# Patient Record
Sex: Female | Born: 1935 | Race: White | Hispanic: No | Marital: Single | Smoking: Never smoker
Health system: Southern US, Community
[De-identification: ages and names within clinical notes are randomized; demographics above are authoritative.]

## PROBLEM LIST (undated history)

## (undated) DIAGNOSIS — I10 Essential (primary) hypertension: Secondary | ICD-10-CM

## (undated) DIAGNOSIS — E079 Disorder of thyroid, unspecified: Secondary | ICD-10-CM

## (undated) DIAGNOSIS — I2510 Atherosclerotic heart disease of native coronary artery without angina pectoris: Secondary | ICD-10-CM

## (undated) DIAGNOSIS — E119 Type 2 diabetes mellitus without complications: Secondary | ICD-10-CM

## (undated) DIAGNOSIS — H3530 Unspecified macular degeneration: Secondary | ICD-10-CM

## (undated) HISTORY — PX: ABDOMINAL HYSTERECTOMY: SHX81

## (undated) HISTORY — PX: FRACTURE SURGERY: SHX138

---

## 2005-09-07 ENCOUNTER — Ambulatory Visit: Payer: Self-pay | Admitting: Internal Medicine

## 2005-09-19 ENCOUNTER — Ambulatory Visit: Payer: Self-pay | Admitting: Internal Medicine

## 2005-10-12 ENCOUNTER — Ambulatory Visit: Payer: Self-pay | Admitting: Surgery

## 2005-10-25 ENCOUNTER — Ambulatory Visit: Payer: Self-pay | Admitting: Surgery

## 2005-10-25 ENCOUNTER — Other Ambulatory Visit: Payer: Self-pay

## 2005-11-01 ENCOUNTER — Ambulatory Visit: Payer: Self-pay | Admitting: Surgery

## 2006-09-25 ENCOUNTER — Ambulatory Visit: Payer: Self-pay | Admitting: Internal Medicine

## 2007-01-31 ENCOUNTER — Ambulatory Visit: Payer: Self-pay | Admitting: Internal Medicine

## 2007-02-26 ENCOUNTER — Ambulatory Visit: Payer: Self-pay | Admitting: Internal Medicine

## 2007-03-29 ENCOUNTER — Ambulatory Visit: Payer: Self-pay | Admitting: Internal Medicine

## 2007-09-27 ENCOUNTER — Ambulatory Visit: Payer: Self-pay | Admitting: Internal Medicine

## 2008-11-24 ENCOUNTER — Ambulatory Visit: Payer: Self-pay | Admitting: Internal Medicine

## 2009-11-25 ENCOUNTER — Ambulatory Visit: Payer: Self-pay | Admitting: Internal Medicine

## 2010-11-30 ENCOUNTER — Ambulatory Visit: Payer: Self-pay | Admitting: Internal Medicine

## 2012-01-11 ENCOUNTER — Ambulatory Visit: Payer: Self-pay | Admitting: Internal Medicine

## 2012-01-16 ENCOUNTER — Inpatient Hospital Stay: Payer: Self-pay | Admitting: Internal Medicine

## 2012-01-16 LAB — CBC
HCT: 39.5 % (ref 35.0–47.0)
HGB: 13 g/dL (ref 12.0–16.0)
MCH: 32.2 pg (ref 26.0–34.0)
MCHC: 33 g/dL (ref 32.0–36.0)
MCV: 98 fL (ref 80–100)
Platelet: 161 10*3/uL (ref 150–440)
RBC: 4.04 10*6/uL (ref 3.80–5.20)

## 2012-01-16 LAB — BASIC METABOLIC PANEL
BUN: 17 mg/dL (ref 7–18)
Co2: 24 mmol/L (ref 21–32)
EGFR (African American): 53 — ABNORMAL LOW
Glucose: 162 mg/dL — ABNORMAL HIGH (ref 65–99)
Osmolality: 277 (ref 275–301)
Potassium: 3.9 mmol/L (ref 3.5–5.1)
Sodium: 136 mmol/L (ref 136–145)

## 2012-01-16 LAB — TROPONIN I: Troponin-I: 0.04 ng/mL

## 2012-01-16 LAB — PROTIME-INR: Prothrombin Time: 26.8 secs — ABNORMAL HIGH (ref 11.5–14.7)

## 2012-01-16 LAB — TSH: Thyroid Stimulating Horm: 3.18 u[IU]/mL

## 2012-01-16 LAB — CK TOTAL AND CKMB (NOT AT ARMC)
CK, Total: 112 U/L (ref 21–215)
CK-MB: 0.8 ng/mL (ref 0.5–3.6)

## 2012-01-17 LAB — TROPONIN I
Troponin-I: 0.06 ng/mL — ABNORMAL HIGH
Troponin-I: 0.07 ng/mL — ABNORMAL HIGH

## 2012-01-18 LAB — PROTIME-INR: INR: 2.3

## 2012-01-19 LAB — PROTIME-INR
INR: 2.5
Prothrombin Time: 26.8 secs — ABNORMAL HIGH (ref 11.5–14.7)

## 2012-01-22 LAB — CULTURE, BLOOD (SINGLE)

## 2014-12-20 NOTE — Consult Note (Signed)
PATIENT NAME:  Stephanie, Welch MR#:  [id number] DATE OF BIRTH:  October 07, 1926  DATE OF CONSULTATION:  01/17/2012  REFERRING PHYSICIAN:  Dr. Allena Katz CONSULTING PHYSICIAN:  Lamar Blinks, MD  PRIMARY CARE PHYSICIAN: Dr. Randa Lynn.   REASON FOR CONSULTATION: Shortness of breath, atrial fibrillation, valvular heart disease, hypertension.   CHIEF COMPLAINT: "I'm short of breath."   HISTORY OF PRESENT ILLNESS: This is an 81 year old female with a history of paroxysmal atrial fibrillation on appropriate medications including Toprol-XL and Coumadin. The patient also has hypertension on ACE inhibitor which has been well controlled in the past. The patient also has borderline hyperlipidemia not requiring further intervention at this time. The patient has been doing fairly well with mild shortness of breath with physical activity, relieved by rest and not associated with chest pain and not significantly worse recently until she had some cough and cold and fever with a productive sputum. At that point, she had a chest x-ray consistent with bronchitis, pneumonia and has had some mild hypoxia. With that she had an EKG due to irregular heartbeat showing atrial fibrillation with rapid ventricular rate. Toprol-XL was increased and the patient had better heart rate control. She has not felt the significant symptoms of rapid heart beat and currently is stable. There is no evidence of significant congestive heart failure type symptoms today or true angina.   REVIEW OF SYSTEMS: The remainder of her review of systems is negative for vision change, ringing in the ears, hearing loss, cough, congestion, heartburn, nausea, vomiting, diarrhea, bloody stools, stomach pain, extremity pain, leg weakness, cramping of the buttocks, known blood clots, headaches, blackouts, dizzy spells, nosebleeds, congestion, trouble swallowing, frequent urination, urination at night, muscle weakness, numbness, anxiety, depression, skin rashes.   PAST  MEDICAL HISTORY:  1. Hypertension.  2. Hyperlipidemia.  3. Valvular heart disease.  4. Atrial fibrillation.   FAMILY HISTORY: No family members with early onset of cardiovascular disease or hypertension.   SOCIAL HISTORY: The patient has remote tobacco use. Currently denies alcohol or tobacco use.   ALLERGIES: No known drug allergies.   CURRENT MEDICATIONS: As listed.   PHYSICAL EXAMINATION:  VITAL SIGNS: Blood pressure 126/68 bilaterally, heart rate 72 upright, reclining, and irregular.   GENERAL: She is a well appearing female in no acute distress.   HEENT: No icterus, thyromegaly, ulcers, hemorrhage, or xanthelasma.   CARDIOVASCULAR: Irregularly irregular with normal S1 and S2, 2/6 apical murmur consistent with mitral regurgitation. Point of maximal impulse is diffuse. Carotid upstroke normal without bruit. Jugular venous pressure is normal.   LUNGS: Lungs have bibasilar crackles and expiratory wheezes with rhonchi.   ABDOMEN: Soft, nontender, without hepatosplenomegaly or masses. Abdominal aorta is normal size without bruit.   EXTREMITIES: 2+ radial, femoral, dorsal pedal pulses with trace lower extremity edema. No cyanosis, clubbing, or ulcers.   NEUROLOGIC: She is oriented to time, place, and person with normal mood and affect.   MUSCULOSKELETAL: She has normal muscle tone without kyphosis. The patient does have some skin lesions, consistent with skin tear.   ASSESSMENT: An 81 year old female with hypertension, hyperlipidemia, and valvular heart disease with acute bronchitis pneumonia causing mild hypoxia, atrial fibrillation with rapid ventricular rate, and abnormal EKG needing further treatment options.   RECOMMENDATIONS:  1. Toprol-XL for heart rate control for further risk reduction in congestive heart failure.  2. Treatment of infection and hypoxia with current medical regimen and antibiotics and inhalers.  3. Echocardiogram for LV systolic dysfunction, valvular heart  disease contributing to atrial  fibrillation and further adjustments of medications. 4. Anticoagulation with Coumadin with a goal INR between 2 to 3 for further risk reduction in stroke with atrial fibrillation.  5. Ambulation with following further closely to find out if the patient needs further adjustments of medication management.  6. Further treatment options after above.     ____________________________ Lamar BlinksBruce J. Odean Fester, MD bjk:rbg D: 01/17/2012 18:15:14 ET T: 01/18/2012 11:19:12 ET JOB#: [id number]  cc: Lamar BlinksBruce J. Jane Birkel, MD, <Dictator> Lamar BlinksBRUCE J Dmitri Pettigrew MD ELECTRONICALLY SIGNED 01/23/2012 7:49

## 2014-12-20 NOTE — H&P (Signed)
PATIENT NAME:  Stephanie Welch, Stephanie Welch MR#:  [id number] DATE OF BIRTH:  06/28/1927  DATE OF ADMISSION:  01/16/2012  PRIMARY CARE PHYSICIAN: Stephanie BucklerAndrew Lamb, MD  CHIEF COMPLAINT: Generalized weakness, fever, and cough.   HISTORY OF PRESENT ILLNESS: Ms. Stephanie Welch is an 81 year old pleasant Caucasian female with past medical history of systemic hypertension and paroxysmal atrial fibrillation. She underwent ablation therapy in the year 2000 and she has been in sinus rhythm since then. The patient stated that today she went to the cafe for dinner. She felt heavy, weak, and tired. She had to sit down on the carpet. She also indicated that for the last few days, since Saturday, she is having cough with little sputum production. She also has some fever. She denies any chest pain, no palpitations, no syncope, and no shortness of breath. Evaluation here in the emergency department reveals atrial fibrillation with rapid ventricular rate, also evidence of interstitial pulmonary edema that is mild. There are also streaks of density at the right lower lobe, possibly secondary to pneumonia. The patient is febrile with a temperature of 100.6. The patient was admitted for further evaluation and management.   REVIEW OF SYSTEMS: CONSTITUTIONAL: She reports low-grade fever, a few chills, and fatigue. EYES: She indicates that she has macular degeneration. Both eyes are matted with yellowish, crusted discharge. It is difficult for her to open her eyes widely. No double vision. ENT: No hearing impairment. No sore throat. No dysphagia. CARDIOVASCULAR: No chest pain. No shortness of breath. No syncope. RESPIRATORY: Reports cough with little sputum production. No shortness of breath. No hemoptysis. GASTROINTESTINAL: No abdominal pain, no vomiting, and no diarrhea. GENITOURINARY: No dysuria and no frequency of urination. MUSCULOSKELETAL: No joint pain or swelling. No muscular pain or swelling. INTEGUMENTARY: No skin rash. No ulcers. NEUROLOGY: No  focal weakness, no seizure activity, and no headache. PSYCHIATRY: No anxiety and no depression. ENDOCRINE: No polyuria, no polydipsia, and no heat or cold intolerance.   PAST MEDICAL HISTORY:  1. Remote history of atrial fibrillation and underwent ablation therapy in September 2000.  2. Chronic anticoagulation with Coumadin.  3. Systemic hypertension. 4. Hypercholesterolemia. 5. Hypothyroidism. 6. Diabetes mellitus type 2. 7. Macular degeneration.   PAST SURGICAL HISTORY:  1. Cardiac ablation therapy for atrial fibrillation.  2. Hysterectomy in 1980.  3. Tonsillectomy at age of 89.   SOCIAL HABITS: Nonsmoker. No history of alcohol abuse. She maybe drinks wine every now and then.   SOCIAL HISTORY: She is single, never married. She lives in an apartment in an assisted facility.   FAMILY HISTORY: Both parent's are deceased. Her father died from lung cancer. Her mother died from colon cancer.   ADMISSION MEDICATIONS:  1. Zocor 40 mg a day. 2. Toprol-XL 150 mg once a day. 3. Metformin 500 mg twice a day. 4. Levoxyl 50 mcg once a day. 5. Potassium 10 mEq once a day. 6. Lasix 20 mg a day. 7. Coumadin - she indicates she takes 4 mg every day.  8. Altace 5 mg once a day.   ALLERGIES: No known drug allergies.   PHYSICAL EXAMINATION:   VITAL SIGNS: Blood pressure 116/58, respiratory rate 22, pulse 132 and irregular, temperature 100.6, and oxygen saturation 94%.   GENERAL APPEARANCE: Elderly pleasant lady lying in bed in no acute distress.   HEAD AND NECK: No pallor. No icterus. No cyanosis.   EARS, NOSE AND THROAT: Hearing was normal. Nasal mucosa, lips, and tongue were normal.   EYES: In both eyes, the eyelashes  are filled with yellowish, crusted material. Pupils are about 5 mm, barely reactive to light. Both conjunctivae are red.   NECK: Supple. Trachea at midline. No thyromegaly. No cervical lymphadenopathy. No masses.   HEART: Regular S1 and S2. No S3 or S4. No murmur. No  gallop. No carotid bruits.   RESPIRATORY: Normal breathing pattern at the time of my examination. She was slightly tachypneic earlier. No rales. No wheezing. Resonant to percussion.   ABDOMEN: Soft without tenderness. No hepatosplenomegaly. No masses. No hernias.   SKIN: No ulcers. No subcutaneous nodules.   MUSCULOSKELETAL: No joint swelling. No clubbing.   NEUROLOGIC: Cranial nerves II through XII are intact. No focal motor deficit.   PSYCHIATRY: The patient is alert and oriented x3. Mood and affect were normal.   LABS/STUDIES: Chest x-ray revealed increased interstitial markings and mild interstitial edema. Streaky density at the right lower lung zone likely secondary to early pneumonia.   EKG showed atrial fibrillation with rapid ventricular rate at 121 per minute. Poor progression of R waves in the anterior chest leads, otherwise unremarkable EKG.  Serum glucose 162, BUN 17, creatinine 1.1, sodium 136, and potassium 3.9. Total CPK 112. Troponin 0.04. CBC showed white count of 9000, hemoglobin 13, hematocrit 39, and platelet count 161. Prothrombin time 26. INR 2.5.   ASSESSMENT:  1. Atrial fibrillation with rapid ventricular rate.  2. Interstitial pulmonary edema that is mild. 3. Right lower lobe streaky density, either atelectasis versus pneumonia, likely is pneumonia given her fever and cough, and I am not sure if this has triggered her atrial fibrillation.  4. Conjunctivitis and blepharitis. 5. Systemic hypertension.  6. Diabetes mellitus type 2. 7. Macular degeneration.  8. Hypothyroidism.  9. Hypercholesterolemia.   PLAN: Admit to the telemetry unit and start antibiotic using Levaquin intravenously to cover for both the pneumonia, her respiratory infection, and also the conjunctivitis, one dose of IV Lasix to reduce the pulmonary interstitial edema, and oxygen supplementation. Hopefully with this treatment, the tachycardia will improve. Meanwhile I will continue beta blocker  using Toprol 150 mg a day. I will check TSH to ensure that she is not hyperthyroid. Cardiology consultation. I did not see any old records in the hospital about this patient and when was the last echocardiogram. Perhaps tomorrow we can obtain that from her primary care physician. Diabetic control. Continue metformin. Place her on Accu-Cheks and sliding scale. Continue anticoagulation with Coumadin. The prothrombin time is therapeutic. Regarding her conjunctivitis and crusted material around her eyes, she will receive local treatment with gentle washing with warm water and using gauze or cotton. The patient indicates to me that she has a Living Will. She has had a copy in this hospital before. She has also a copy that is left with her brother who lives in [place]. He is an Pensions consultant. She also gave him the power of attorney to make medical decisions for her.   TIME SPENT EVALUATING THIS PATIENT: More than 55 minutes.  ____________________________ Carney Corners. Rudene Re, MD amd:slb D: 01/16/2012 23:08:02 ET T: 01/17/2012 07:35:35 ET JOB#: [id number]  cc: Carney Corners. Rudene Re, MD, <Dictator> Reola Mosher. Randa Lynn, MD Zollie Scale MD ELECTRONICALLY SIGNED 01/18/2012 5:16

## 2014-12-20 NOTE — Discharge Summary (Signed)
PATIENT NAME:  Stephanie Welch, Gayatri E MR#:  [id number] DATE OF BIRTH:  [birth year]  DATE OF ADMISSION:  01/16/2012 DATE OF DISCHARGE:  01/19/2012  DISCHARGE DIAGNOSES: 1. Atrial fibrillation with rapid ventricular response.  2. Elevated troponin due to demand ischemia.  3. Possible bronchitis.  4. Conjunctivitis. 5. Blepharitis. 6. Hypertension. 7. Diabetes. 8. Hypothyroidism. 9. Hyperlipidemia. 10. Macular degeneration.  DISPOSITION: The patient is being discharged home with home health and physical therapy.   DIET: Low sodium, ADA 1800 calorie diet.   ACTIVITY: As tolerated.   DISCHARGE MEDICATIONS:  1. Levaquin 250 mg daily for five days. 2. Tobramycin 0.3% two drops to each eye every six hours for five days. 3. Cardizem CD 120 mg daily at 2:00 p.m.  4. Robitussin-DM 10 mL every six hours p.r.n. cough. 5. Occuplex 1 capsule daily.  6. Levoxyl 50 mcg daily.  7. Toprol-XL 150 mg daily.  8. Lasix 20 mg daily.  9. Coumadin 4 mg daily.  10. Zocor 20 mg daily.  11. Metformin 500 mg twice a day. 12. KCL 10 milliequivalents daily.   CONSULTANTS: Arnoldo HookerBruce Kowalski, MD - Cardiology.  RESULTS: Chest x-ray showed mildly increased bilateral interstitial markings. No focal pneumonia.   Microbiology: Blood cultures - no growth so far.  INR is therapeutic, 2.5. Complete metabolic panel normal. Cardiac enzymes ranging from 0.04 to 0.07. TSH 2.18. CBC normal.   HOSPITAL COURSE: The patient is an 81 year old female with past medical history of atrial fibrillation status post ablation in 2000, on Coumadin, hypertension, hyperlipidemia, hypothyroidism, diabetes, and macular degeneration who presented with weakness and fatigue. She was found to have atrial fibrillation with rapid ventricular response. Cardiology consultation with Dr. Gwen PoundsKowalski was obtained who recommended continuing the patient's current dose of Toprol-XL and adding Cardizem. Following this treatment regimen, the patient's heart rate  remained well controlled. She is in Coumadin and her INR is therapeutic. She was evaluated by physical therapy who recommended home health for her and that is going to be arranged for the patient. She had elevated an troponin. This was felt to be due to demand ischemia. The patient had no complaints of chest pain. The patient reported having some cough, congestion, and low-grade fever. A chest x-ray showed no infiltrate or pneumonia. She was diagnosed with possible bronchitis and started on empiric antibiotics and p.r.n. antitussive which resulted in improvement of her symptoms. Her blood cultures have been negative so far. The patient also had evidence of conjunctivitis and blepharitis and was treated with tobramycin eyedrops with good results. Her hypertension and diabetes remained well controlled. She will continue her statin therapy. Her TSH was checked and is normal. She will continue the current dose of her Synthroid. Home health and physical therapy have been arranged for the patient. She is being discharged in a stable condition.   TIME SPENT: 45 minutes.  ____________________________ Darrick MeigsSangeeta Elvy Mclarty, MD sp:slb D: 01/19/2012 14:16:24 ET     T: 01/22/2012 10:45:43 ET        JOB#: [id number] cc: Darrick MeigsSangeeta Kahmya Pinkham, MD, <Dictator> Darrick MeigsSANGEETA Margaree Sandhu MD ELECTRONICALLY SIGNED 01/23/2012 12:17

## 2015-10-20 ENCOUNTER — Encounter: Payer: Self-pay | Admitting: Emergency Medicine

## 2015-10-20 ENCOUNTER — Emergency Department: Payer: Medicare Other

## 2015-10-20 ENCOUNTER — Emergency Department
Admission: EM | Admit: 2015-10-20 | Discharge: 2015-10-20 | Disposition: A | Discharge status: Home / Self Care | Payer: Medicare Other | Attending: Emergency Medicine | Admitting: Emergency Medicine

## 2015-10-20 ENCOUNTER — Encounter
Admission: RE | Admit: 2015-10-20 | Discharge: 2015-10-20 | Disposition: A | Discharge status: Home / Self Care | Payer: Medicare Other | Source: Ambulatory Visit | Attending: Internal Medicine | Admitting: Internal Medicine

## 2015-10-20 DIAGNOSIS — S0990XA Unspecified injury of head, initial encounter: Secondary | ICD-10-CM | POA: Diagnosis not present

## 2015-10-20 DIAGNOSIS — I10 Essential (primary) hypertension: Secondary | ICD-10-CM | POA: Insufficient documentation

## 2015-10-20 DIAGNOSIS — E119 Type 2 diabetes mellitus without complications: Secondary | ICD-10-CM | POA: Diagnosis not present

## 2015-10-20 DIAGNOSIS — S42412A Displaced simple supracondylar fracture without intercondylar fracture of left humerus, initial encounter for closed fracture: Secondary | ICD-10-CM | POA: Insufficient documentation

## 2015-10-20 DIAGNOSIS — Y9389 Activity, other specified: Secondary | ICD-10-CM | POA: Diagnosis not present

## 2015-10-20 DIAGNOSIS — I4891 Unspecified atrial fibrillation: Secondary | ICD-10-CM | POA: Insufficient documentation

## 2015-10-20 DIAGNOSIS — S59902A Unspecified injury of left elbow, initial encounter: Secondary | ICD-10-CM | POA: Diagnosis present

## 2015-10-20 DIAGNOSIS — W01198A Fall on same level from slipping, tripping and stumbling with subsequent striking against other object, initial encounter: Secondary | ICD-10-CM | POA: Diagnosis not present

## 2015-10-20 DIAGNOSIS — Y9289 Other specified places as the place of occurrence of the external cause: Secondary | ICD-10-CM | POA: Insufficient documentation

## 2015-10-20 DIAGNOSIS — W19XXXA Unspecified fall, initial encounter: Secondary | ICD-10-CM

## 2015-10-20 DIAGNOSIS — Y998 Other external cause status: Secondary | ICD-10-CM | POA: Diagnosis not present

## 2015-10-20 HISTORY — DX: Essential (primary) hypertension: I10

## 2015-10-20 HISTORY — DX: Atherosclerotic heart disease of native coronary artery without angina pectoris: I25.10

## 2015-10-20 HISTORY — DX: Unspecified macular degeneration: H35.30

## 2015-10-20 HISTORY — DX: Disorder of thyroid, unspecified: E07.9

## 2015-10-20 HISTORY — DX: Type 2 diabetes mellitus without complications: E11.9

## 2015-10-20 MED ORDER — OXYCODONE-ACETAMINOPHEN 5-325 MG PO TABS
ORAL_TABLET | ORAL | Status: AC
  Administered 2015-10-20: 1 via ORAL
  Filled 2015-10-20: qty 1

## 2015-10-20 MED ORDER — OXYCODONE-ACETAMINOPHEN 5-325 MG PO TABS
1.0000 | ORAL_TABLET | Freq: Once | ORAL | Status: AC
  Administered 2015-10-20: 1 via ORAL
  Filled 2015-10-20: qty 1

## 2015-10-20 MED ORDER — OXYCODONE-ACETAMINOPHEN 5-325 MG PO TABS
1.0000 | ORAL_TABLET | Freq: Once | ORAL | Status: AC
  Administered 2015-10-20: 1 via ORAL

## 2015-10-20 MED ORDER — OXYCODONE-ACETAMINOPHEN 5-325 MG PO TABS: 2.0000 | ORAL_TABLET | Freq: Four times a day (QID) | ORAL | Status: DC | PRN

## 2015-10-20 NOTE — ED Notes (Signed)
Spoke with Virgel Manifold at [facility]. Transportation will be here in 15-20 minutes

## 2015-10-20 NOTE — ED Notes (Signed)
PT at bedside.

## 2015-10-20 NOTE — NC FL2 (Signed)
  [place] MEDICAID FL2 LEVEL OF CARE SCREENING TOOL     IDENTIFICATION  Patient Name: Stephanie Welch Birthdate: 08/19/1927 Sex: female Admission Date (Current Location): 10/20/2015  County and Medicaid Number:  [place]   Facility and Address:  [facility], [address], [place], [place] [address]      Provider Number: [id number]  Attending Physician Name and Address:  Jonathan E Williams, MD  Relative Name and Phone Number:       Current Level of Care: Hospital Recommended Level of Care: Assisted Living Facility Prior Approval Number:    Date Approved/Denied:   PASRR Number:    Discharge Plan: Other (Comment) (From independant to ALF)    Current Diagnoses: There are no active problems to display for this patient.   Orientation RESPIRATION BLADDER Height & Weight     Self, Time, Situation, Place  Normal Continent Weight: 187 lb (84.823 kg) Height:  5' 6" (167.6 cm)  BEHAVIORAL SYMPTOMS/MOOD NEUROLOGICAL BOWEL NUTRITION STATUS      Continent  (diabetic)  AMBULATORY STATUS COMMUNICATION OF NEEDS Skin   Limited Assist Verbally Normal                       Personal Care Assistance Level of Assistance  Bathing, Feeding, Dressing, Total care Bathing Assistance: Maximum assistance Feeding assistance: Maximum assistance Dressing Assistance: Maximum assistance Total Care Assistance: Maximum assistance   Functional Limitations Info  Sight, Hearing, Speech Sight Info: Impaired (Blindness ) Hearing Info: Impaired Speech Info: Adequate    SPECIAL CARE FACTORS FREQUENCY                       Contractures      Additional Factors Info                  Current Medications (10/20/2015):  This is the current hospital active medication list No current facility-administered medications for this encounter.   Current Outpatient Prescriptions  Medication Sig Dispense Refill  . oxyCODONE-acetaminophen (PERCOCET) 5-325 MG  tablet Take 2 tablets by mouth every 6 (six) hours as needed for moderate pain or severe pain. 30 tablet 0     Discharge Medications: Please see discharge summary for a list of discharge medications.  Relevant Imaging Results:  Relevant Lab Results:   Additional Information [id number] SSN  Zacharias Ridling M, LCSW     

## 2015-10-20 NOTE — ED Notes (Addendum)
Pt arrived via EMS from [place] s/p fall. Pt states she was trying to look at the size of panties because she was going to go shopping today. Pt fell and hit her head and left elbow.  Denies LOC. States she turned and lost her balance.

## 2015-10-20 NOTE — ED Notes (Signed)
Reviewed d/c instructions, follow-up care, prescriptions, use of ice and elevation, and pain management with pt. Pt verbalized understanding.

## 2015-10-20 NOTE — NC FL2 (Signed)
  [place] MEDICAID FL2 LEVEL OF CARE SCREENING TOOL     IDENTIFICATION  Patient Name: Stephanie Welch Birthdate: 10/16/1926 Sex: female Admission Date (Current Location): 10/20/2015  County and Medicaid Number:  [place]   Facility and Address:  [facility], [address], [place], [place] [address]      Provider Number: [id number]  Attending Physician Name and Address:  Jonathan E Williams, MD  Relative Name and Phone Number:       Current Level of Care: Hospital Recommended Level of Care: Assisted Living Facility Prior Approval Number:    Date Approved/Denied:   PASRR Number:    Discharge Plan: Other (Comment) (From independant to ALF)    Current Diagnoses: There are no active problems to display for this patient.   Orientation RESPIRATION BLADDER Height & Weight     Self, Time, Situation, Place  Normal Continent Weight: 187 lb (84.823 kg) Height:  5' 6" (167.6 cm)  BEHAVIORAL SYMPTOMS/MOOD NEUROLOGICAL BOWEL NUTRITION STATUS      Continent  (diabetic)  AMBULATORY STATUS COMMUNICATION OF NEEDS Skin   Limited Assist Verbally Normal                       Personal Care Assistance Level of Assistance  Bathing, Feeding, Dressing, Total care Bathing Assistance: Maximum assistance Feeding assistance: Maximum assistance Dressing Assistance: Maximum assistance Total Care Assistance: Maximum assistance   Functional Limitations Info  Sight, Hearing, Speech Sight Info: Impaired (Blindness ) Hearing Info: Impaired Speech Info: Adequate    SPECIAL CARE FACTORS FREQUENCY                       Contractures      Additional Factors Info                  Current Medications (10/20/2015):  This is the current hospital active medication list No current facility-administered medications for this encounter.   Current Outpatient Prescriptions  Medication Sig Dispense Refill  . oxyCODONE-acetaminophen (PERCOCET) 5-325 MG  tablet Take 2 tablets by mouth every 6 (six) hours as needed for moderate pain or severe pain. 30 tablet 0     Discharge Medications: Please see discharge summary for a list of discharge medications.  Relevant Imaging Results:  Relevant Lab Results:   Additional Information [id number] SSN  Khi Mcmillen M, LCSW     

## 2015-10-20 NOTE — Progress Notes (Signed)
LCSW received call back from Clare Gandy [phone] [place] and patient does have 14 days of 24-hour nursing which she can access.   LCSW will complete a PASSR request and forward all information to [facility] via [employer].  [employer] LCSW [phone]

## 2015-10-20 NOTE — Evaluation (Signed)
Physical Therapy Evaluation Patient Details Name: Stephanie Welch MRN: [id number] DOB: 02-23-1927 Today's Date: 10/20/2015   History of Present Illness  Patient is an 82 y/o female that presents after sustaining a fall at her independent living facility. She sustained a minimially displaced L distal humeral fx. She is noted to have severe macular degeneration as well.   Clinical Impression  Patient admitted from ILF with fall resulting in distal humeral fx. Patient is now requiring fairly significant assistance with sit to stand transfer (mod Ax1) with SPC. She demonstrates poor sequencing with SPC during ambulation as well as markedly decreased step length, and variable step lengths indicative of high risk of subsequent falls. Given her clinical picture, would recommend 24 hour assistance and HHPT if possible from ALF, if high level of care is not able to be provided from facility patient resides, patient would benefit from SNF placement to increase her safety with OOB mobility.     Follow Up Recommendations SNF;Home health PT;Supervision/Assistance - 24 hour (Patient will require 24 hour assistance with mobility as well as HHPT if she were to return to previous facility, otherwise she would require SNF)    Equipment Recommendations  Cane    Recommendations for Other Services       Precautions / Restrictions Precautions Precautions: Fall Restrictions Weight Bearing Restrictions: Yes LUE Weight Bearing: Non weight bearing Other Position/Activity Restrictions: No formal orders, but sling in place. Would recommend NWB until further details provided.       Mobility  Bed Mobility Overal bed mobility: Needs Assistance Bed Mobility: Supine to Sit;Sit to Supine     Supine to sit: Min guard;HOB elevated Sit to supine: Min assist   General bed mobility comments: No assistance required to bring herself to EOB, however she required assistance to bring her LEs onto the bed after return to  bed.   Transfers Overall transfer level: Needs assistance Equipment used: Straight cane Transfers: Sit to/from Stand Sit to Stand: Mod assist         General transfer comment: PT allowed patient multiple attempts to stand, however patient is unable without significant assistance to complete transfer.   Ambulation/Gait Ambulation/Gait assistance: Min guard Ambulation Distance (Feet): 20 Feet Assistive device: Straight cane Gait Pattern/deviations: Decreased step length - left;Decreased step length - right;Shuffle;Narrow base of support;Trunk flexed   Gait velocity interpretation: <1.8 ft/sec, indicative of risk for recurrent falls General Gait Details: Patient with very poor sequencing with SPC, narrow BOS, very minimal step lengths with variable step lengths.   Stairs            Wheelchair Mobility    Modified Rankin (Stroke Patients Only)       Balance Overall balance assessment: Needs assistance Sitting-balance support: No upper extremity supported Sitting balance-Leahy Scale: Good     Standing balance support: Single extremity supported Standing balance-Leahy Scale: Fair                               Pertinent Vitals/Pain Pain Assessment:  (Patient reports mild pain in her L elbow. )    Home Living Family/patient expects to be discharged to:: Private residence Living Arrangements: Alone   Type of Home: Independent living facility       Home Layout: One level Home Equipment: Environmental consultant - 2 wheels;Cane - single point      Prior Function Level of Independence: Independent with assistive device(s)  Comments: Prior to this incident patient was independent with a cane or RW.      Hand Dominance        Extremity/Trunk Assessment   Upper Extremity Assessment: LUE deficits/detail;RUE deficits/detail RUE Deficits / Details: Grossly intact ROM, strength not formally tested         Lower Extremity Assessment: Generalized  weakness         Communication   Communication: No difficulties  Cognition Arousal/Alertness: Awake/alert Behavior During Therapy: WFL for tasks assessed/performed Overall Cognitive Status: Within Functional Limits for tasks assessed                      General Comments      Exercises        Assessment/Plan    PT Assessment Patient needs continued PT services  PT Diagnosis Difficulty walking;Generalized weakness   PT Problem List Decreased strength;Decreased activity tolerance;Decreased balance;Decreased mobility;Decreased knowledge of use of DME  PT Treatment Interventions DME instruction;Balance training;Gait training;Stair training;Therapeutic activities;Therapeutic exercise   PT Goals (Current goals can be found in the Care Plan section) Acute Rehab PT Goals Patient Stated Goal: To return home  PT Goal Formulation: With patient Time For Goal Achievement: 11/03/15 Potential to Achieve Goals: Good    Frequency 7X/week   Barriers to discharge Inaccessible home environment Patient will require a higher level of care than she previously was receiving.     Co-evaluation               End of Session Equipment Utilized During Treatment: Gait belt (L elbow sling) Activity Tolerance: Patient tolerated treatment well Patient left: in bed;with family/visitor present Nurse Communication: Mobility status    Functional Assessment Tool Used: Tinetti (18/28) Functional Limitation: Mobility: Walking and moving around Mobility: Walking and Moving Around Current Status (Z6109): At least 40 percent but less than 60 percent impaired, limited or restricted Mobility: Walking and Moving Around Goal Status [phone]): At least 20 percent but less than 40 percent impaired, limited or restricted    Time: 1206-1223 PT Time Calculation (min) (ACUTE ONLY): 17 min   Charges:   PT Evaluation $PT Eval Moderate Complexity: 1 Procedure     PT G Codes:   PT G-Codes **NOT FOR  INPATIENT CLASS** Functional Assessment Tool Used: Tinetti (18/28) Functional Limitation: Mobility: Walking and moving around Mobility: Walking and Moving Around Current Status (U9811): At least 40 percent but less than 60 percent impaired, limited or restricted Mobility: Walking and Moving Around Goal Status [phone]): At least 20 percent but less than 40 percent impaired, limited or restricted    Kerin Ransom, PT, DPT    10/20/2015, 2:41 PM

## 2015-10-20 NOTE — Discharge Instructions (Signed)
Humerus Fracture Treated With Immobilization The humerus is the large bone in your upper arm. You have a broken (fractured) humerus. These fractures are easily diagnosed with X-rays. TREATMENT  Simple fractures which will heal without disability are treated with simple immobilization. Immobilization means you will wear a cast, splint, or sling. You have a fracture which will do well with immobilization. The fracture will heal well simply by being held in a good position until it is stable enough to begin range of motion exercises. Do not take part in activities which would further injure your arm.  HOME CARE INSTRUCTIONS   Put ice on the injured area.  Put ice in a plastic bag.  Place a towel between your skin and the bag.  Leave the ice on for 15-20 minutes, 03-04 times a day.  If you have a cast:  Do not scratch the skin under the cast using sharp or pointed objects.  Check the skin around the cast every day. You may put lotion on any red or sore areas.  Keep your cast dry and clean.  If you have a splint:  Wear the splint as directed.  Keep your splint dry and clean.  You may loosen the elastic around the splint if your fingers become numb, tingle, or turn cold or blue.  If you have a sling:  Wear the sling as directed.  Do not put pressure on any part of your cast or splint until it is fully hardened.  Your cast or splint can be protected during bathing with a plastic bag. Do not lower the cast or splint into water.  Only take over-the-counter or prescription medicines for pain, discomfort, or fever as directed by your caregiver.  Do range of motion exercises as instructed by your caregiver.  Follow up as directed by your caregiver. This is very important in order to avoid permanent injury or disability and chronic pain. SEEK IMMEDIATE MEDICAL CARE IF:   Your skin or nails in the injured arm turn blue or gray.  Your arm feels cold or numb.  You develop severe pain  in the injured arm.  You are having problems with the medicines you were given. MAKE SURE YOU:   Understand these instructions.  Will watch your condition.  Will get help right away if you are not doing well or get worse.   This information is not intended to replace advice given to you by your health care provider. Make sure you discuss any questions you have with your health care provider.

## 2015-10-20 NOTE — Progress Notes (Signed)
LCSW completed PASSR request awaiting number MUST ID [id number], called Kim  The patients room number 344 but they cant transport until they have a PASSR number. LCSW will check e-mails for number  Berstein Hilliker [facility] LCSW [phone]

## 2015-10-20 NOTE — Progress Notes (Signed)
RECEIVED PASSR # [id number]  Called and e-mailed it into Selena Batten, nurse to follow up now with call to report and patient assigned to room 344 as per Selena Batten, Admissions coordinator. Her cell phone # [phone]  Stephanie Broadfoot LCSW

## 2015-10-20 NOTE — Clinical Social Work Note (Signed)
Clinical Social Work Assessment  Patient Details  Name: Stephanie Welch MRN: [id number] Date of Birth: 03-Sep-1926  Date of referral:  10/20/15               Reason for consult:  Other (Comment Required) (Increased need for higher level of care)                Permission sought to share information with:  Family Supports, Magazine features editor Permission granted to share information::  Yes, Verbal Permission Granted  Name::     Armandina Stammer [phone]  cell [phone]  Agency::  [facility] [place] [employer] [facility] [place]  Relationship::  yes  Contact Information:  Yes  Housing/Transportation Living arrangements for the past 2 months:  Market researcher of Information:  Patient, Engineer, mining Patient Interpreter Needed:  None Criminal Activity/Legal Involvement Pertinent to Current Situation/Hospitalization:  No - Comment as needed Significant Relationships:  Friend Lives with:  Self Do you feel safe going back to the place where you live?  No (Need extra support- needs ALF ) Need for family participation in patient care:  No (Coment)  Care giving concerns:  Patient requires higher level of care   Social Worker assessment / plan:  Contact facility and complete assessment and fl2  Employment status:  Retired Health and safety inspector:  Medicare (BCBS/Medicare state health PPO) PT Recommendations:  Not assessed at this time (awaiting consult) Information / Referral to community resources:  Acute Rehab  Patient/Family's Response to care:  Patient would like to go to ALF 24 hour care  Patient/Family's Understanding of and Emotional Response to Diagnosis, Current Treatment, and Prognosis:  Has no family, just her friend Darel Hong  Emotional Assessment Appearance:  Appears stated age Attitude/Demeanor/Rapport:   (very polite, oriented to person,place and event) Affect (typically observed):  Accepting, Adaptable, Happy, Hopeful Orientation:  Oriented to Self, Oriented  to Place, Oriented to  Time, Oriented to Situation Alcohol / Substance use:  Alcohol Use (White wine before dinner) Psych involvement (Current and /or in the community):  No (Comment)  Discharge Needs  Concerns to be addressed:  Care Coordination, Discharge Planning Concerns Readmission within the last 30 days:  No Current discharge risk:  Lives alone Barriers to Discharge:  Unsafe home situation   Cheron Schaumann, LCSW 10/20/2015, 11:48 AM

## 2015-10-20 NOTE — ED Notes (Signed)
MD at bedside. 

## 2015-10-20 NOTE — Progress Notes (Signed)
LCSW had EDP Dr Mayford Knife sign off on FL2 and 30 day note and faxed her H&P to PASSR [place] MUST so passr number can be obtained and patient can go. Faxed all materials to [employer] and requested patient to be transported and brought to there facility. LCSW will await response.  Genesis Paget LCSW

## 2015-10-20 NOTE — Care Management Note (Addendum)
Case Management Note  Patient Details  Name: TITANIA GAULT MRN: [id number] Date of Birth: 1926/11/28  Subjective/Objective: CM consulted by CSW to assist this 82 y.o. F seen in the ED after a fall where she sustained a  Minimally displaced supracondylar distal humerus fracture. Pt has Macular Degeneration which is of concern because she lives [facility] [facility] [place] [facility] [place]  Family does not live close and she has no-one who can assist her in the immediate recovery period. Pt unable to recognize who was talking to her as I was entering the room until I approached the stretcher where she was lying, maybe 2 feet. CSW/CM  would like a PT evaluation to assist with SAFE discharge/Disposition. Spoke with Baxter Hire PT who will attempt to perform before 1300 to facilitate discharge. There is a possibility that Respit care is provided at the facility where she resides.          Action/Plan:Awaiting PT evaluation. CSW is in contact with facility to see if pt has contract with facility to assist in situations like this. Will continue to follow.    Expected Discharge Date:                  Expected Discharge Plan:     In-House Referral:     Discharge planning Services  CM Consult  Post Acute Care Choice:    Choice offered to:  Patient (Friend at Bedside)  DME Arranged:    DME Agency:     HH Arranged:    HH Agency:     Status of Service:  In process, will continue to follow  Medicare Important Message Given:    Date Medicare IM Given:    Medicare IM give by:    Date Additional Medicare IM Given:    Additional Medicare Important Message give by:     If discussed at Long Length of Stay Meetings, dates discussed:    Additional Comments:  Yvone Neu, RN 10/20/2015, 11:22 AM

## 2015-10-20 NOTE — NC FL2 (Deleted)
   MEDICAID FL2 LEVEL OF CARE SCREENING TOOL     IDENTIFICATION  Patient Name: Stephanie Welch Birthdate: 1927-03-12 Sex: female Admission Date (Current Location): 10/20/2015  [facility] and [place] Number:      Facility and Address:         Provider Number:    Attending Physician Name and Address:  Emily Filbert, MD  Relative Name and Phone Number:       Current Level of Care:   Recommended Level of Care:   Prior Approval Number:    Date Approved/Denied:   PASRR Number:    Discharge Plan:      Current Diagnoses: There are no active problems to display for this patient.   Orientation RESPIRATION BLADDER Height & Weight            Weight: 187 lb (84.823 kg) Height:   (167.6 cm)  BEHAVIORAL SYMPTOMS/MOOD NEUROLOGICAL BOWEL NUTRITION STATUS           AMBULATORY STATUS COMMUNICATION OF NEEDS Skin                               Personal Care Assistance Level of Assistance              Functional Limitations Info             SPECIAL CARE FACTORS FREQUENCY                       Contractures      Additional Factors Info                  Current Medications (10/20/2015):  This is the current hospital active medication list No current facility-administered medications for this encounter.   Current Outpatient Prescriptions  Medication Sig Dispense Refill  . oxyCODONE-acetaminophen (PERCOCET) 5-325 MG tablet Take 2 tablets by mouth every 6 (six) hours as needed for moderate pain or severe pain. 30 tablet 0     Discharge Medications: Please see discharge summary for a list of discharge medications.  Relevant Imaging Results:  Relevant Lab Results:   Additional Information    Johnella Moloney, [place], [place]

## 2015-10-20 NOTE — ED Notes (Signed)
Waiting on social work to help with assisted type placement upon discharge.

## 2015-10-20 NOTE — ED Provider Notes (Signed)
[facility] Emergency Department Provider Note     Time seen: ----------------------------------------- 9:16 AM on 10/20/2015 -----------------------------------------    I have reviewed the triage vital signs and the nursing notes.   HISTORY  Chief Complaint Fall    HPI Stephanie Welch is a 82 y.o. female brought to the ER for management would independent living after a fall. Patient states she was picking at her underwear and she lost her balance. She states she hit her head and she is complaining of left elbow pain. She's been having trouble with her knees and she just lost balance. She denies any recent illness, denies eating weak or lightheaded. Her main complaint this time is left elbow pain. Movement makes it worse.   Past Medical History  Diagnosis Date  . Thyroid disease   . Hypertension   . Diabetes mellitus without complication (HCC)   . Macular degeneration   . Coronary artery disease     There are no active problems to display for this patient.   Past Surgical History  Procedure Laterality Date  . Abdominal hysterectomy      Allergies Review of patient's allergies indicates no known allergies.  Social History Social History  Substance Use Topics  . Smoking status: Never Smoker   . Smokeless tobacco: None  . Alcohol Use: No    Review of Systems Constitutional: Negative for fever. Eyes: Negative for visual changes. ENT: Negative for sore throat. Cardiovascular: Negative for chest pain. Respiratory: Negative for shortness of breath. Gastrointestinal: Negative for abdominal pain, vomiting and diarrhea. Genitourinary: Negative for dysuria. Musculoskeletal: Positive for left elbow pain Skin: Negative for rash. Neurological: Negative for headaches, focal weakness or numbness.  10-point ROS otherwise negative.  ____________________________________________   PHYSICAL EXAM:  VITAL SIGNS: ED Triage Vitals  Enc Vitals  Group     BP 10/20/15 0911 136/89 mmHg     Pulse Rate 10/20/15 0911 79     Resp --      Temp 10/20/15 0911 98.7 F (37.1 C)     Temp Source 10/20/15 0911 Oral     SpO2 10/20/15 0911 99 %     Weight --      Height --      Head Cir --      Peak Flow --      Pain Score --      Pain Loc --      Pain Edu? --      Excl. in GC? --     Constitutional: Alert and oriented. Well appearing and in no distress. Eyes: Conjunctivae are normal. PERRL. Normal extraocular movements. ENT   Head: Normocephalic and atraumatic.   Nose: No congestion/rhinnorhea.   Mouth/Throat: Mucous membranes are moist.   Neck: No stridor. Cardiovascular: Normal rate, regular rhythm. Normal and symmetric distal pulses are present in all extremities. No murmurs, rubs, or gallops. Respiratory: Normal respiratory effort without tachypnea nor retractions. Breath sounds are clear and equal bilaterally. No wheezes/rales/rhonchi. Gastrointestinal: Soft and nontender. No distention. No abdominal bruits.  Musculoskeletal: Mild swelling is noted around the left elbow, pain is noted with range of motion of the left elbow Neurologic:  Normal speech and language. No gross focal neurologic deficits are appreciated.  Skin:  Skin is warm, dry and intact. No rash noted. Psychiatric: Mood and affect are normal. Speech and behavior are normal. Patient exhibits appropriate insight and judgment. ____________________________________________  ED COURSE:  Pertinent labs & imaging results that were available during my care of  the patient were reviewed by me and considered in my medical decision making (see chart for details). Patient recently worked that was unremarkable, we'll obtain x-ray and CT imaging. INR a week ago was 3.0 ____________________________________________  RADIOLOGY Images were viewed by me  CT head, left elbow IMPRESSION: Minimally displaced supracondylar distal humerus fracture. IMPRESSION: Atrophy  with patchy supratentorial small vessel disease. No intracranial mass, hemorrhage, or extra-axial fluid collection. No acute infarct evident. Areas of ethmoid sinus disease. ____________________________________________  FINAL ASSESSMENT AND PLAN  Fall, head injury, supracondylar fracture  Plan: Patient with imaging as dictated above. Patient be placed in a long-arm posterior splint. She'll be referred to orthopedics for follow-up. CT scan is unremarkable as dictated above.   Emily Filbert, MD   Emily Filbert, MD 10/20/15 [phone]

## 2015-10-20 NOTE — NC FL2 (Signed)
  [place] MEDICAID FL2 LEVEL OF CARE SCREENING TOOL     IDENTIFICATION  Patient Name: Stephanie Welch Birthdate: May 18, 1927 Sex: female Admission Date (Current Location): 10/20/2015  [place] and [place] Number:  Chiropodist and Address:  [facility], [address] [place], [place] [address]      Provider Number: [id number]  Attending Physician Name and Address:  Emily Filbert, MD  Relative Name and Phone Number:       Current Level of Care: Hospital Recommended Level of Care: Assisted Living Facility Prior Approval Number:    Date Approved/Denied:   PASRR Number:    Discharge Plan: Other (Comment) (From independant to ALF)    Current Diagnoses: There are no active problems to display for this patient.   Orientation RESPIRATION BLADDER Height & Weight     Self, Time, Situation, Place  Normal Continent Weight: 187 lb (84.823 kg) Height:   (167.6 cm)  BEHAVIORAL SYMPTOMS/MOOD NEUROLOGICAL BOWEL NUTRITION STATUS      Continent  (diabetic)  AMBULATORY STATUS COMMUNICATION OF NEEDS Skin   Limited Assist Verbally Normal                       Personal Care Assistance Level of Assistance  Bathing, Feeding, Dressing, Total care Bathing Assistance: Maximum assistance Feeding assistance: Maximum assistance Dressing Assistance: Maximum assistance Total Care Assistance: Maximum assistance   Functional Limitations Info  Sight, Hearing, Speech Sight Info: Impaired (Blindness ) Hearing Info: Impaired Speech Info: Adequate    SPECIAL CARE FACTORS FREQUENCY                       Contractures      Additional Factors Info                  Current Medications (10/20/2015):  This is the current hospital active medication list No current facility-administered medications for this encounter.   Current Outpatient Prescriptions  Medication Sig Dispense Refill  . oxyCODONE-acetaminophen (PERCOCET) 5-325 MG  tablet Take 2 tablets by mouth every 6 (six) hours as needed for moderate pain or severe pain. 30 tablet 0     Discharge Medications: Please see discharge summary for a list of discharge medications.  Relevant Imaging Results:  Relevant Lab Results:   Additional Information [id number] SSN  Stephanie Welch, [place], [place]

## 2015-10-20 NOTE — [place] FL2 (Cosign Needed)
  [place] MEDICAID FL2 LEVEL OF CARE SCREENING TOOL     IDENTIFICATION  Patient Name: Stephanie Welch Birthdate: 05/12/1927 Sex: female Admission Date (Current Location): 10/20/2015  County and Medicaid Number:  [place]   Facility and Address:  [facility], [address], [place], [place] [address]      Provider Number: [id number]  Attending Physician Name and Address:  Jonathan E Williams, MD  Relative Name and Phone Number:       Current Level of Care: Hospital Recommended Level of Care: Assisted Living Facility Prior Approval Number:    Date Approved/Denied:   PASRR Number:    Discharge Plan: Other (Comment) (From independant to ALF)    Current Diagnoses: There are no active problems to display for this patient.   Orientation RESPIRATION BLADDER Height & Weight     Self, Time, Situation, Place  Normal Continent Weight: 187 lb (84.823 kg) Height:  5' 6" (167.6 cm)  BEHAVIORAL SYMPTOMS/MOOD NEUROLOGICAL BOWEL NUTRITION STATUS      Continent  (diabetic)  AMBULATORY STATUS COMMUNICATION OF NEEDS Skin   Limited Assist Verbally Normal                       Personal Care Assistance Level of Assistance  Bathing, Feeding, Dressing, Total care Bathing Assistance: Maximum assistance Feeding assistance: Maximum assistance Dressing Assistance: Maximum assistance Total Care Assistance: Maximum assistance   Functional Limitations Info  Sight, Hearing, Speech Sight Info: Impaired (Blindness ) Hearing Info: Impaired Speech Info: Adequate    SPECIAL CARE FACTORS FREQUENCY                       Contractures      Additional Factors Info                  Current Medications (10/20/2015):  This is the current hospital active medication list No current facility-administered medications for this encounter.   Current Outpatient Prescriptions  Medication Sig Dispense Refill  . oxyCODONE-acetaminophen (PERCOCET) 5-325 MG  tablet Take 2 tablets by mouth every 6 (six) hours as needed for moderate pain or severe pain. 30 tablet 0     Discharge Medications: Please see discharge summary for a list of discharge medications.  Relevant Imaging Results:  Relevant Lab Results:   Additional Information [id number] SSN  Shamarcus Hoheisel M, LCSW     

## 2015-10-20 NOTE — Progress Notes (Signed)
LCSW met with patient. Called [facility] Ronna Polio stated patient would have to pay out of pocket. She gave me number to speak to Casimer Bilis, called, awaiting call back.  Discussed with patient home health options and we will await PT consult that was ordered by Doctor. Patient has been very independent until recent fall. She could use more support with Macular degeneration, will complete assessment and collect data.  [employer] LCSW [phone]

## 2015-10-22 DIAGNOSIS — I4891 Unspecified atrial fibrillation: Secondary | ICD-10-CM | POA: Diagnosis present

## 2015-10-22 DIAGNOSIS — E119 Type 2 diabetes mellitus without complications: Secondary | ICD-10-CM | POA: Diagnosis present

## 2015-10-22 LAB — CBC WITH DIFFERENTIAL/PLATELET
BASOS ABS: 0 10*3/uL (ref 0–0.1)
Basophils Relative: 1 %
Eosinophils Absolute: 0.1 10*3/uL (ref 0–0.7)
Eosinophils Relative: 1 %
HEMATOCRIT: 38.7 % (ref 35.0–47.0)
Hemoglobin: 12.9 g/dL (ref 12.0–16.0)
LYMPHS PCT: 16 %
Lymphs Abs: 1.1 10*3/uL (ref 1.0–3.6)
MCH: 31.6 pg (ref 26.0–34.0)
MCHC: 33.4 g/dL (ref 32.0–36.0)
MCV: 94.6 fL (ref 80.0–100.0)
MONO ABS: 0.7 10*3/uL (ref 0.2–0.9)
MONOS PCT: 11 %
NEUTROS ABS: 4.9 10*3/uL (ref 1.4–6.5)
Neutrophils Relative %: 71 %
Platelets: 167 10*3/uL (ref 150–440)
RBC: 4.09 MIL/uL (ref 3.80–5.20)
RDW: 13.9 % (ref 11.5–14.5)
WBC: 6.9 10*3/uL (ref 3.6–11.0)

## 2015-10-22 LAB — PROTIME-INR
INR: 2.37
Prothrombin Time: 25.6 seconds — ABNORMAL HIGH (ref 11.4–15.0)

## 2015-10-22 LAB — MAGNESIUM: Magnesium: 1.9 mg/dL (ref 1.7–2.4)

## 2015-10-22 LAB — GLUCOSE, CAPILLARY
Glucose-Capillary: 110 mg/dL — ABNORMAL HIGH (ref 65–99)
Glucose-Capillary: 135 mg/dL — ABNORMAL HIGH (ref 65–99)

## 2015-10-22 LAB — COMPREHENSIVE METABOLIC PANEL
ALK PHOS: 123 U/L (ref 38–126)
ALT: 109 U/L — AB (ref 14–54)
AST: 105 U/L — ABNORMAL HIGH (ref 15–41)
Albumin: 3.2 g/dL — ABNORMAL LOW (ref 3.5–5.0)
Anion gap: 11 (ref 5–15)
BUN: 16 mg/dL (ref 6–20)
CHLORIDE: 108 mmol/L (ref 101–111)
CO2: 21 mmol/L — AB (ref 22–32)
CREATININE: 0.77 mg/dL (ref 0.44–1.00)
Calcium: 8.5 mg/dL — ABNORMAL LOW (ref 8.9–10.3)
GFR calc non Af Amer: >60 mL/min (ref >60)
Glucose, Bld: 105 mg/dL — ABNORMAL HIGH (ref 65–99)
Potassium: 3.9 mmol/L (ref 3.5–5.1)
SODIUM: 140 mmol/L (ref 135–145)
Total Bilirubin: 0.9 mg/dL (ref 0.3–1.2)
Total Protein: 6.1 g/dL — ABNORMAL LOW (ref 6.5–8.1)

## 2015-10-22 LAB — VITAMIN B12: Vitamin B-12: 691 pg/mL (ref 180–914)

## 2015-10-22 LAB — TSH: TSH: 2.746 u[IU]/mL (ref 0.350–4.500)

## 2015-10-23 LAB — VITAMIN D 25 HYDROXY (VIT D DEFICIENCY, FRACTURES): VIT D 25 HYDROXY: 18.3 ng/mL — AB (ref 30.0–100.0)

## 2015-10-23 LAB — GLUCOSE, CAPILLARY
Glucose-Capillary: 113 mg/dL — ABNORMAL HIGH (ref 65–99)
Glucose-Capillary: 95 mg/dL (ref 65–99)

## 2015-10-24 LAB — GLUCOSE, CAPILLARY
Glucose-Capillary: 98 mg/dL (ref 65–99)
Glucose-Capillary: 85 mg/dL (ref 65–99)

## 2015-10-25 LAB — GLUCOSE, CAPILLARY
GLUCOSE-CAPILLARY: 118 mg/dL — AB (ref 65–99)
GLUCOSE-CAPILLARY: 74 mg/dL (ref 65–99)

## 2015-10-26 DIAGNOSIS — E119 Type 2 diabetes mellitus without complications: Secondary | ICD-10-CM | POA: Diagnosis not present

## 2015-10-26 LAB — GLUCOSE, CAPILLARY
Glucose-Capillary: 104 mg/dL — ABNORMAL HIGH (ref 65–99)
Glucose-Capillary: 174 mg/dL — ABNORMAL HIGH (ref 65–99)

## 2015-10-26 LAB — PROTIME-INR
INR: 2.32
PROTHROMBIN TIME: 25.2 s — AB (ref 11.4–15.0)

## 2015-10-27 ENCOUNTER — Encounter
Admission: RE | Admit: 2015-10-27 | Discharge: 2015-10-27 | Disposition: A | Discharge status: Home / Self Care | Payer: Medicare Other | Source: Ambulatory Visit | Attending: Internal Medicine | Admitting: Internal Medicine

## 2015-10-27 DIAGNOSIS — I482 Chronic atrial fibrillation: Secondary | ICD-10-CM | POA: Insufficient documentation

## 2015-10-27 DIAGNOSIS — E119 Type 2 diabetes mellitus without complications: Secondary | ICD-10-CM | POA: Insufficient documentation

## 2015-10-27 LAB — GLUCOSE, CAPILLARY
GLUCOSE-CAPILLARY: 112 mg/dL — AB (ref 65–99)
Glucose-Capillary: 117 mg/dL — ABNORMAL HIGH (ref 65–99)

## 2015-10-28 LAB — GLUCOSE, CAPILLARY
GLUCOSE-CAPILLARY: 105 mg/dL — AB (ref 65–99)
GLUCOSE-CAPILLARY: 133 mg/dL — AB (ref 65–99)

## 2015-10-29 DIAGNOSIS — I482 Chronic atrial fibrillation: Secondary | ICD-10-CM | POA: Diagnosis present

## 2015-10-29 DIAGNOSIS — E119 Type 2 diabetes mellitus without complications: Secondary | ICD-10-CM | POA: Diagnosis present

## 2015-10-29 LAB — GLUCOSE, CAPILLARY
GLUCOSE-CAPILLARY: 110 mg/dL — AB (ref 65–99)
Glucose-Capillary: 107 mg/dL — ABNORMAL HIGH (ref 65–99)

## 2015-10-30 DIAGNOSIS — E119 Type 2 diabetes mellitus without complications: Secondary | ICD-10-CM | POA: Diagnosis not present

## 2015-10-30 LAB — GLUCOSE, CAPILLARY
Glucose-Capillary: 118 mg/dL — ABNORMAL HIGH (ref 65–99)
Glucose-Capillary: 183 mg/dL — ABNORMAL HIGH (ref 65–99)

## 2015-10-31 DIAGNOSIS — E119 Type 2 diabetes mellitus without complications: Secondary | ICD-10-CM | POA: Diagnosis not present

## 2015-10-31 LAB — GLUCOSE, CAPILLARY
GLUCOSE-CAPILLARY: 109 mg/dL — AB (ref 65–99)
GLUCOSE-CAPILLARY: 83 mg/dL (ref 65–99)

## 2015-11-01 DIAGNOSIS — E119 Type 2 diabetes mellitus without complications: Secondary | ICD-10-CM | POA: Diagnosis not present

## 2015-11-01 LAB — GLUCOSE, CAPILLARY
GLUCOSE-CAPILLARY: 114 mg/dL — AB (ref 65–99)
GLUCOSE-CAPILLARY: 92 mg/dL (ref 65–99)
Glucose-Capillary: 109 mg/dL — ABNORMAL HIGH (ref 65–99)

## 2015-11-02 DIAGNOSIS — E119 Type 2 diabetes mellitus without complications: Secondary | ICD-10-CM | POA: Diagnosis not present

## 2015-11-02 LAB — PROTIME-INR
INR: 2.08
PROTHROMBIN TIME: 23.2 s — AB (ref 11.4–15.0)

## 2015-11-02 LAB — GLUCOSE, CAPILLARY
Glucose-Capillary: 114 mg/dL — ABNORMAL HIGH (ref 65–99)
Glucose-Capillary: 123 mg/dL — ABNORMAL HIGH (ref 65–99)

## 2015-11-03 DIAGNOSIS — E119 Type 2 diabetes mellitus without complications: Secondary | ICD-10-CM | POA: Diagnosis not present

## 2015-11-03 LAB — GLUCOSE, CAPILLARY
Glucose-Capillary: 125 mg/dL — ABNORMAL HIGH (ref 65–99)
Glucose-Capillary: 122 mg/dL — ABNORMAL HIGH (ref 65–99)

## 2015-11-04 DIAGNOSIS — E119 Type 2 diabetes mellitus without complications: Secondary | ICD-10-CM | POA: Diagnosis not present

## 2015-11-04 LAB — GLUCOSE, CAPILLARY
GLUCOSE-CAPILLARY: 111 mg/dL — AB (ref 65–99)
GLUCOSE-CAPILLARY: 121 mg/dL — AB (ref 65–99)

## 2015-11-05 DIAGNOSIS — E119 Type 2 diabetes mellitus without complications: Secondary | ICD-10-CM | POA: Diagnosis not present

## 2015-11-05 LAB — GLUCOSE, CAPILLARY
GLUCOSE-CAPILLARY: 100 mg/dL — AB (ref 65–99)
Glucose-Capillary: 114 mg/dL — ABNORMAL HIGH (ref 65–99)

## 2015-11-06 DIAGNOSIS — E119 Type 2 diabetes mellitus without complications: Secondary | ICD-10-CM | POA: Diagnosis not present

## 2015-11-06 LAB — GLUCOSE, CAPILLARY
GLUCOSE-CAPILLARY: 109 mg/dL — AB (ref 65–99)
GLUCOSE-CAPILLARY: 112 mg/dL — AB (ref 65–99)

## 2015-11-07 DIAGNOSIS — E119 Type 2 diabetes mellitus without complications: Secondary | ICD-10-CM | POA: Diagnosis not present

## 2015-11-07 LAB — GLUCOSE, CAPILLARY
GLUCOSE-CAPILLARY: 127 mg/dL — AB (ref 65–99)
Glucose-Capillary: 115 mg/dL — ABNORMAL HIGH (ref 65–99)

## 2015-11-08 DIAGNOSIS — E119 Type 2 diabetes mellitus without complications: Secondary | ICD-10-CM | POA: Diagnosis not present

## 2015-11-08 LAB — GLUCOSE, CAPILLARY
GLUCOSE-CAPILLARY: 122 mg/dL — AB (ref 65–99)
GLUCOSE-CAPILLARY: 173 mg/dL — AB (ref 65–99)

## 2015-11-09 DIAGNOSIS — E119 Type 2 diabetes mellitus without complications: Secondary | ICD-10-CM | POA: Diagnosis not present

## 2015-11-09 LAB — GLUCOSE, CAPILLARY
GLUCOSE-CAPILLARY: 107 mg/dL — AB (ref 65–99)
GLUCOSE-CAPILLARY: 48 mg/dL — AB (ref 65–99)
Glucose-Capillary: 102 mg/dL — ABNORMAL HIGH (ref 65–99)
Glucose-Capillary: 51 mg/dL — ABNORMAL LOW (ref 65–99)

## 2015-11-09 LAB — PROTIME-INR
INR: 2.38
PROTHROMBIN TIME: 25.7 s — AB (ref 11.4–15.0)

## 2015-11-10 DIAGNOSIS — E119 Type 2 diabetes mellitus without complications: Secondary | ICD-10-CM | POA: Diagnosis not present

## 2015-11-10 LAB — GLUCOSE, CAPILLARY
GLUCOSE-CAPILLARY: 124 mg/dL — AB (ref 65–99)
Glucose-Capillary: 113 mg/dL — ABNORMAL HIGH (ref 65–99)

## 2015-11-11 DIAGNOSIS — E119 Type 2 diabetes mellitus without complications: Secondary | ICD-10-CM | POA: Diagnosis not present

## 2015-11-11 LAB — GLUCOSE, CAPILLARY
GLUCOSE-CAPILLARY: 79 mg/dL (ref 65–99)
Glucose-Capillary: 113 mg/dL — ABNORMAL HIGH (ref 65–99)

## 2015-11-12 DIAGNOSIS — E119 Type 2 diabetes mellitus without complications: Secondary | ICD-10-CM | POA: Diagnosis not present

## 2015-11-12 LAB — GLUCOSE, CAPILLARY
GLUCOSE-CAPILLARY: 111 mg/dL — AB (ref 65–99)
GLUCOSE-CAPILLARY: 122 mg/dL — AB (ref 65–99)

## 2015-11-13 DIAGNOSIS — E119 Type 2 diabetes mellitus without complications: Secondary | ICD-10-CM | POA: Diagnosis not present

## 2015-11-13 LAB — GLUCOSE, CAPILLARY
GLUCOSE-CAPILLARY: 109 mg/dL — AB (ref 65–99)
Glucose-Capillary: 164 mg/dL — ABNORMAL HIGH (ref 65–99)

## 2015-11-14 DIAGNOSIS — E119 Type 2 diabetes mellitus without complications: Secondary | ICD-10-CM | POA: Diagnosis not present

## 2015-11-14 LAB — GLUCOSE, CAPILLARY: Glucose-Capillary: 106 mg/dL — ABNORMAL HIGH (ref 65–99)

## 2015-11-15 DIAGNOSIS — E119 Type 2 diabetes mellitus without complications: Secondary | ICD-10-CM | POA: Diagnosis not present

## 2015-11-15 LAB — GLUCOSE, CAPILLARY
GLUCOSE-CAPILLARY: 103 mg/dL — AB (ref 65–99)
GLUCOSE-CAPILLARY: 81 mg/dL (ref 65–99)

## 2015-11-16 DIAGNOSIS — E119 Type 2 diabetes mellitus without complications: Secondary | ICD-10-CM | POA: Diagnosis not present

## 2015-11-16 LAB — PROTIME-INR
INR: 2.78
Prothrombin Time: 28.9 seconds — ABNORMAL HIGH (ref 11.4–15.0)

## 2015-11-16 LAB — GLUCOSE, CAPILLARY
GLUCOSE-CAPILLARY: 102 mg/dL — AB (ref 65–99)
GLUCOSE-CAPILLARY: 105 mg/dL — AB (ref 65–99)

## 2015-11-17 DIAGNOSIS — E119 Type 2 diabetes mellitus without complications: Secondary | ICD-10-CM | POA: Diagnosis not present

## 2015-11-17 LAB — GLUCOSE, CAPILLARY
GLUCOSE-CAPILLARY: 117 mg/dL — AB (ref 65–99)
GLUCOSE-CAPILLARY: 94 mg/dL (ref 65–99)

## 2015-11-18 DIAGNOSIS — E119 Type 2 diabetes mellitus without complications: Secondary | ICD-10-CM | POA: Diagnosis not present

## 2015-11-18 LAB — GLUCOSE, CAPILLARY
Glucose-Capillary: 107 mg/dL — ABNORMAL HIGH (ref 65–99)
Glucose-Capillary: 120 mg/dL — ABNORMAL HIGH (ref 65–99)
Glucose-Capillary: 96 mg/dL (ref 65–99)

## 2015-11-19 DIAGNOSIS — E119 Type 2 diabetes mellitus without complications: Secondary | ICD-10-CM | POA: Diagnosis not present

## 2015-11-19 LAB — GLUCOSE, CAPILLARY
GLUCOSE-CAPILLARY: 111 mg/dL — AB (ref 65–99)
GLUCOSE-CAPILLARY: 96 mg/dL (ref 65–99)

## 2015-11-22 DIAGNOSIS — E119 Type 2 diabetes mellitus without complications: Secondary | ICD-10-CM | POA: Diagnosis not present

## 2015-11-22 LAB — GLUCOSE, CAPILLARY
GLUCOSE-CAPILLARY: 108 mg/dL — AB (ref 65–99)
Glucose-Capillary: 108 mg/dL — ABNORMAL HIGH (ref 65–99)
Glucose-Capillary: 114 mg/dL — ABNORMAL HIGH (ref 65–99)
Glucose-Capillary: 116 mg/dL — ABNORMAL HIGH (ref 65–99)
Glucose-Capillary: 110 mg/dL — ABNORMAL HIGH (ref 65–99)

## 2015-11-23 DIAGNOSIS — E119 Type 2 diabetes mellitus without complications: Secondary | ICD-10-CM | POA: Diagnosis not present

## 2015-11-23 LAB — PROTIME-INR
INR: 2.23
Prothrombin Time: 24.5 seconds — ABNORMAL HIGH (ref 11.4–15.0)

## 2015-11-27 ENCOUNTER — Encounter
Admission: RE | Admit: 2015-11-27 | Discharge: 2015-11-27 | Disposition: A | Discharge status: Home / Self Care | Payer: Medicare Other | Source: Ambulatory Visit | Attending: Internal Medicine | Admitting: Internal Medicine

## 2015-11-27 DIAGNOSIS — E119 Type 2 diabetes mellitus without complications: Secondary | ICD-10-CM | POA: Insufficient documentation

## 2015-11-27 DIAGNOSIS — I10 Essential (primary) hypertension: Secondary | ICD-10-CM | POA: Insufficient documentation

## 2015-11-27 DIAGNOSIS — I4891 Unspecified atrial fibrillation: Secondary | ICD-10-CM | POA: Insufficient documentation

## 2015-11-29 LAB — GLUCOSE, CAPILLARY: GLUCOSE-CAPILLARY: 130 mg/dL — AB (ref 65–99)

## 2015-11-30 DIAGNOSIS — I4891 Unspecified atrial fibrillation: Secondary | ICD-10-CM | POA: Diagnosis present

## 2015-11-30 DIAGNOSIS — I10 Essential (primary) hypertension: Secondary | ICD-10-CM | POA: Diagnosis present

## 2015-11-30 DIAGNOSIS — E119 Type 2 diabetes mellitus without complications: Secondary | ICD-10-CM | POA: Diagnosis present

## 2015-11-30 LAB — GLUCOSE, CAPILLARY
GLUCOSE-CAPILLARY: 70 mg/dL (ref 65–99)
Glucose-Capillary: 93 mg/dL (ref 65–99)

## 2015-11-30 LAB — PROTIME-INR
INR: 1.98
Prothrombin Time: 22.4 seconds — ABNORMAL HIGH (ref 11.4–15.0)

## 2015-12-01 LAB — GLUCOSE, CAPILLARY
GLUCOSE-CAPILLARY: 88 mg/dL (ref 65–99)
GLUCOSE-CAPILLARY: 120 mg/dL — AB (ref 65–99)
Glucose-Capillary: 97 mg/dL (ref 65–99)
Glucose-Capillary: 110 mg/dL — ABNORMAL HIGH (ref 65–99)
Glucose-Capillary: 94 mg/dL (ref 65–99)
Glucose-Capillary: 96 mg/dL (ref 65–99)

## 2015-12-02 LAB — GLUCOSE, CAPILLARY
GLUCOSE-CAPILLARY: 98 mg/dL (ref 65–99)
Glucose-Capillary: 101 mg/dL — ABNORMAL HIGH (ref 65–99)

## 2015-12-03 LAB — GLUCOSE, CAPILLARY
Glucose-Capillary: 91 mg/dL (ref 65–99)
Glucose-Capillary: 122 mg/dL — ABNORMAL HIGH (ref 65–99)

## 2015-12-04 LAB — GLUCOSE, CAPILLARY
GLUCOSE-CAPILLARY: 138 mg/dL — AB (ref 65–99)
Glucose-Capillary: 97 mg/dL (ref 65–99)

## 2015-12-05 LAB — GLUCOSE, CAPILLARY
GLUCOSE-CAPILLARY: 112 mg/dL — AB (ref 65–99)
GLUCOSE-CAPILLARY: 80 mg/dL (ref 65–99)

## 2015-12-06 LAB — GLUCOSE, CAPILLARY
GLUCOSE-CAPILLARY: 105 mg/dL — AB (ref 65–99)
GLUCOSE-CAPILLARY: 90 mg/dL (ref 65–99)

## 2015-12-07 LAB — GLUCOSE, CAPILLARY
GLUCOSE-CAPILLARY: 95 mg/dL (ref 65–99)
GLUCOSE-CAPILLARY: 72 mg/dL (ref 65–99)

## 2015-12-08 LAB — GLUCOSE, CAPILLARY
GLUCOSE-CAPILLARY: 107 mg/dL — AB (ref 65–99)
Glucose-Capillary: 109 mg/dL — ABNORMAL HIGH (ref 65–99)

## 2015-12-09 LAB — GLUCOSE, CAPILLARY
GLUCOSE-CAPILLARY: 82 mg/dL (ref 65–99)
GLUCOSE-CAPILLARY: 102 mg/dL — AB (ref 65–99)

## 2015-12-10 LAB — GLUCOSE, CAPILLARY
Glucose-Capillary: 100 mg/dL — ABNORMAL HIGH (ref 65–99)
Glucose-Capillary: 132 mg/dL — ABNORMAL HIGH (ref 65–99)

## 2015-12-11 LAB — GLUCOSE, CAPILLARY
GLUCOSE-CAPILLARY: 111 mg/dL — AB (ref 65–99)
GLUCOSE-CAPILLARY: 97 mg/dL (ref 65–99)

## 2015-12-12 LAB — GLUCOSE, CAPILLARY
Glucose-Capillary: 102 mg/dL — ABNORMAL HIGH (ref 65–99)
Glucose-Capillary: 86 mg/dL (ref 65–99)

## 2015-12-13 LAB — GLUCOSE, CAPILLARY
GLUCOSE-CAPILLARY: 98 mg/dL (ref 65–99)
GLUCOSE-CAPILLARY: 95 mg/dL (ref 65–99)

## 2015-12-14 LAB — GLUCOSE, CAPILLARY
GLUCOSE-CAPILLARY: 104 mg/dL — AB (ref 65–99)
GLUCOSE-CAPILLARY: 71 mg/dL (ref 65–99)

## 2015-12-15 LAB — GLUCOSE, CAPILLARY
Glucose-Capillary: 96 mg/dL (ref 65–99)
Glucose-Capillary: 101 mg/dL — ABNORMAL HIGH (ref 65–99)

## 2015-12-16 DIAGNOSIS — E119 Type 2 diabetes mellitus without complications: Secondary | ICD-10-CM | POA: Diagnosis not present

## 2015-12-16 LAB — GLUCOSE, CAPILLARY
GLUCOSE-CAPILLARY: 99 mg/dL (ref 65–99)
GLUCOSE-CAPILLARY: 117 mg/dL — AB (ref 65–99)

## 2015-12-16 LAB — CBC
HEMATOCRIT: 39.9 % (ref 35.0–47.0)
HEMOGLOBIN: 13.1 g/dL (ref 12.0–16.0)
MCH: 31.1 pg (ref 26.0–34.0)
MCHC: 32.7 g/dL (ref 32.0–36.0)
MCV: 95 fL (ref 80.0–100.0)
Platelets: 201 10*3/uL (ref 150–440)
RBC: 4.2 MIL/uL (ref 3.80–5.20)
RDW: 14.3 % (ref 11.5–14.5)
WBC: 5.5 10*3/uL (ref 3.6–11.0)

## 2015-12-16 LAB — BASIC METABOLIC PANEL
ANION GAP: 5 (ref 5–15)
BUN: 19 mg/dL (ref 6–20)
CO2: 29 mmol/L (ref 22–32)
Calcium: 9 mg/dL (ref 8.9–10.3)
Chloride: 105 mmol/L (ref 101–111)
Creatinine, Ser: 0.91 mg/dL (ref 0.44–1.00)
GFR calc Af Amer: >60 mL/min (ref >60)
GFR, EST NON AFRICAN AMERICAN: 55 mL/min — AB (ref >60)
Glucose, Bld: 137 mg/dL — ABNORMAL HIGH (ref 65–99)
POTASSIUM: 4.1 mmol/L (ref 3.5–5.1)
SODIUM: 139 mmol/L (ref 135–145)

## 2015-12-16 LAB — PROTIME-INR
INR: 2.21
Prothrombin Time: 24.3 seconds — ABNORMAL HIGH (ref 11.4–15.0)

## 2015-12-17 LAB — GLUCOSE, CAPILLARY
GLUCOSE-CAPILLARY: 106 mg/dL — AB (ref 65–99)
Glucose-Capillary: 107 mg/dL — ABNORMAL HIGH (ref 65–99)

## 2015-12-18 LAB — GLUCOSE, CAPILLARY
Glucose-Capillary: 98 mg/dL (ref 65–99)
Glucose-Capillary: 172 mg/dL — ABNORMAL HIGH (ref 65–99)

## 2015-12-19 LAB — GLUCOSE, CAPILLARY
GLUCOSE-CAPILLARY: 91 mg/dL (ref 65–99)
Glucose-Capillary: 103 mg/dL — ABNORMAL HIGH (ref 65–99)

## 2015-12-20 LAB — GLUCOSE, CAPILLARY
Glucose-Capillary: 88 mg/dL (ref 65–99)
Glucose-Capillary: 112 mg/dL — ABNORMAL HIGH (ref 65–99)

## 2015-12-21 LAB — GLUCOSE, CAPILLARY
GLUCOSE-CAPILLARY: 86 mg/dL (ref 65–99)
Glucose-Capillary: 104 mg/dL — ABNORMAL HIGH (ref 65–99)

## 2015-12-22 LAB — GLUCOSE, CAPILLARY
GLUCOSE-CAPILLARY: 94 mg/dL (ref 65–99)
GLUCOSE-CAPILLARY: 87 mg/dL (ref 65–99)

## 2015-12-23 DIAGNOSIS — E119 Type 2 diabetes mellitus without complications: Secondary | ICD-10-CM | POA: Diagnosis not present

## 2015-12-23 LAB — GLUCOSE, CAPILLARY: GLUCOSE-CAPILLARY: 120 mg/dL — AB (ref 65–99)

## 2015-12-23 LAB — PROTIME-INR
INR: 2.12
Prothrombin Time: 23.6 seconds — ABNORMAL HIGH (ref 11.4–15.0)

## 2016-01-22 ENCOUNTER — Emergency Department: Payer: Medicare Other

## 2016-01-22 ENCOUNTER — Inpatient Hospital Stay
Admission: EM | Admit: 2016-01-22 | Discharge: 2016-01-26 | DRG: 469 | Disposition: A | Discharge status: Skilled Nursing Facility | Payer: Medicare Other | Attending: Internal Medicine | Admitting: Internal Medicine

## 2016-01-22 ENCOUNTER — Encounter: Payer: Self-pay | Admitting: Emergency Medicine

## 2016-01-22 DIAGNOSIS — Z7984 Long term (current) use of oral hypoglycemic drugs: Secondary | ICD-10-CM

## 2016-01-22 DIAGNOSIS — I10 Essential (primary) hypertension: Secondary | ICD-10-CM

## 2016-01-22 DIAGNOSIS — I2510 Atherosclerotic heart disease of native coronary artery without angina pectoris: Secondary | ICD-10-CM | POA: Diagnosis present

## 2016-01-22 DIAGNOSIS — H3530 Unspecified macular degeneration: Secondary | ICD-10-CM

## 2016-01-22 DIAGNOSIS — E119 Type 2 diabetes mellitus without complications: Secondary | ICD-10-CM | POA: Diagnosis present

## 2016-01-22 DIAGNOSIS — I4891 Unspecified atrial fibrillation: Secondary | ICD-10-CM | POA: Diagnosis present

## 2016-01-22 DIAGNOSIS — I110 Hypertensive heart disease with heart failure: Secondary | ICD-10-CM | POA: Diagnosis present

## 2016-01-22 DIAGNOSIS — W010XXA Fall on same level from slipping, tripping and stumbling without subsequent striking against object, initial encounter: Secondary | ICD-10-CM | POA: Diagnosis present

## 2016-01-22 DIAGNOSIS — Z7901 Long term (current) use of anticoagulants: Secondary | ICD-10-CM

## 2016-01-22 DIAGNOSIS — Y92120 Kitchen in nursing home as the place of occurrence of the external cause: Secondary | ICD-10-CM

## 2016-01-22 DIAGNOSIS — S72001A Fracture of unspecified part of neck of right femur, initial encounter for closed fracture: Secondary | ICD-10-CM | POA: Diagnosis not present

## 2016-01-22 DIAGNOSIS — I5023 Acute on chronic systolic (congestive) heart failure: Secondary | ICD-10-CM | POA: Diagnosis present

## 2016-01-22 DIAGNOSIS — S7290XA Unspecified fracture of unspecified femur, initial encounter for closed fracture: Secondary | ICD-10-CM | POA: Diagnosis present

## 2016-01-22 DIAGNOSIS — Z96649 Presence of unspecified artificial hip joint: Secondary | ICD-10-CM

## 2016-01-22 DIAGNOSIS — E039 Hypothyroidism, unspecified: Secondary | ICD-10-CM

## 2016-01-22 DIAGNOSIS — Z79899 Other long term (current) drug therapy: Secondary | ICD-10-CM

## 2016-01-22 LAB — CBC WITH DIFFERENTIAL/PLATELET
BASOS ABS: 0.1 10*3/uL (ref 0–0.1)
Basophils Relative: 1 %
EOS ABS: 0.1 10*3/uL (ref 0–0.7)
Eosinophils Relative: 1 %
HCT: 40.2 % (ref 35.0–47.0)
HEMOGLOBIN: 13.2 g/dL (ref 12.0–16.0)
Lymphocytes Relative: 11 %
Lymphs Abs: 0.9 10*3/uL — ABNORMAL LOW (ref 1.0–3.6)
MCH: 31.5 pg (ref 26.0–34.0)
MCHC: 32.9 g/dL (ref 32.0–36.0)
MCV: 95.9 fL (ref 80.0–100.0)
MONO ABS: 0.8 10*3/uL (ref 0.2–0.9)
Monocytes Relative: 10 %
NEUTROS PCT: 77 %
Neutro Abs: 6.2 10*3/uL (ref 1.4–6.5)
Platelets: 179 10*3/uL (ref 150–440)
RBC: 4.19 MIL/uL (ref 3.80–5.20)
RDW: 14.5 % (ref 11.5–14.5)
WBC: 8.1 10*3/uL (ref 3.6–11.0)

## 2016-01-22 LAB — URINALYSIS COMPLETE WITH MICROSCOPIC (ARMC ONLY)
BILIRUBIN URINE: NEGATIVE
Bacteria, UA: NONE SEEN
GLUCOSE, UA: NEGATIVE mg/dL
HGB URINE DIPSTICK: NEGATIVE
Ketones, ur: NEGATIVE mg/dL
Leukocytes, UA: NEGATIVE
Nitrite: NEGATIVE
Protein, ur: NEGATIVE mg/dL
SPECIFIC GRAVITY, URINE: 1.018 (ref 1.005–1.030)
pH: 5 (ref 5.0–8.0)

## 2016-01-22 LAB — BASIC METABOLIC PANEL
ANION GAP: 8 (ref 5–15)
BUN: 23 mg/dL — ABNORMAL HIGH (ref 6–20)
CALCIUM: 9 mg/dL (ref 8.9–10.3)
CO2: 25 mmol/L (ref 22–32)
CREATININE: 0.83 mg/dL (ref 0.44–1.00)
Chloride: 106 mmol/L (ref 101–111)
GFR calc non Af Amer: >60 mL/min (ref >60)
Glucose, Bld: 105 mg/dL — ABNORMAL HIGH (ref 65–99)
Potassium: 4.5 mmol/L (ref 3.5–5.1)
SODIUM: 139 mmol/L (ref 135–145)

## 2016-01-22 LAB — TYPE AND SCREEN
ABO/RH(D): AB NEG
Antibody Screen: NEGATIVE

## 2016-01-22 LAB — PROTIME-INR
INR: 2.1
PROTHROMBIN TIME: 23.4 s — AB (ref 11.4–15.0)

## 2016-01-22 LAB — ABO/RH: ABO/RH(D): AB NEG

## 2016-01-22 MED ORDER — CEFAZOLIN SODIUM-DEXTROSE 2-4 GM/100ML-% IV SOLN
2.0000 g | INTRAVENOUS | Status: AC
  Administered 2016-01-22: 2 g via INTRAVENOUS
  Filled 2016-01-22: qty 100

## 2016-01-22 MED ORDER — ONDANSETRON HCL 4 MG/2ML IJ SOLN
4.0000 mg | Freq: Once | INTRAMUSCULAR | Status: AC
  Administered 2016-01-22: 4 mg via INTRAVENOUS
  Filled 2016-01-22: qty 2

## 2016-01-22 MED ORDER — MORPHINE SULFATE (PF) 4 MG/ML IV SOLN
4.0000 mg | INTRAVENOUS | Status: DC | PRN
  Administered 2016-01-22: 4 mg via INTRAVENOUS
  Filled 2016-01-22: qty 1

## 2016-01-22 NOTE — ED Notes (Signed)
16 French foley placed without difficulty

## 2016-01-22 NOTE — ED Notes (Signed)
Pt. States having rt. Knee pain for the past two weeks.  Pt. States rt. Knee gave out while standing and fell on rt. Side.  Pt. States pain to rt. Hip  And rt. Pelvic.  No obvious deformity noted.

## 2016-01-22 NOTE — ED Notes (Signed)
Pt reports she fell when she went to carry her dinner to the dining area (while using walker) - she turned around to use walker and just "went down" with pain in the right knee and this caused her to hit her right hip on the floor

## 2016-01-22 NOTE — ED Provider Notes (Signed)
[facility] Emergency Department Provider Note  ____________________________________________    I have reviewed the triage vital signs and the nursing notes.   HISTORY  Chief Complaint Fall    HPI Stephanie Welch is a 82 y.o. female who presents after mechanical fall. Patient reports she lost her balance and fell onto her right hip while attempting to get to the dinner table. She complains of severe pain in her right hip when she tries to move her right leg. She does not have pain when she holds very still. She denies other injury. No chest pain. No abdominal pain. No nausea or vomiting.     Past Medical History  Diagnosis Date  . Thyroid disease   . Hypertension   . Diabetes mellitus without complication (HCC)   . Macular degeneration   . Coronary artery disease     There are no active problems to display for this patient.   Past Surgical History  Procedure Laterality Date  . Abdominal hysterectomy    . Fracture surgery      Current Outpatient Rx  Name  Route  Sig  Dispense  Refill  . citalopram (CELEXA) 10 MG tablet   Oral   Take 10 mg by mouth daily.         Marland Kitchen. COUMADIN 2 MG tablet   Oral   Take 4-6 mg by mouth See admin instructions. Take 2 tablets (4mg ) by mouth on Monday, Tuesday, Wednesday, and Thursday. Take 3 tablets (6mg ) by mouth on Friday, Saturday, and Sunday           Dispense as written.   . furosemide (LASIX) 20 MG tablet   Oral   Take 20 mg by mouth daily.         Marland Kitchen. glimepiride (AMARYL) 2 MG tablet   Oral   Take 2 mg by mouth every morning.         Marland Kitchen. levothyroxine (SYNTHROID, LEVOTHROID) 100 MCG tablet   Oral   Take 100 mcg by mouth daily before breakfast.         . metFORMIN (GLUCOPHAGE) 500 MG tablet   Oral   Take 500 mg by mouth 2 (two) times daily.         . metoprolol succinate (TOPROL-XL) 50 MG 24 hr tablet   Oral   Take 150 mg by mouth daily with breakfast.          . Multiple  Vitamins-Minerals (OCUVITE-LUTEIN PO)   Oral   Take 1 tablet by mouth every morning.         . ramipril (ALTACE) 5 MG capsule   Oral   Take 5 mg by mouth daily.         Marland Kitchen. oxyCODONE-acetaminophen (PERCOCET) 5-325 MG tablet   Oral   Take 2 tablets by mouth every 6 (six) hours as needed for moderate pain or severe pain.   30 tablet   0     Allergies Conjugated estrogens  No family history on file.  Social History Social History  Substance Use Topics  . Smoking status: Never Smoker   . Smokeless tobacco: None  . Alcohol Use: No    Review of Systems  Constitutional: Negative for Dizziness Eyes: Negative for blurry vision ENT: Negative for neck pain Cardiovascular: Negative for chest pain Respiratory: Negative for shortness of breath. Gastrointestinal: Negative for abdominal pain Genitourinary: Negative for dysuria. Musculoskeletal: Negative for back pain. Positive for hip pain as above Skin: Negative for rash. Neurological: Negative  for focal weakness Psychiatric: no anxiety    ____________________________________________   PHYSICAL EXAM:  VITAL SIGNS: ED Triage Vitals  Enc Vitals Group     BP 01/22/16 1922 125/94 mmHg     Pulse Rate 01/22/16 1922 94     Resp 01/22/16 1922 18     Temp 01/22/16 1922 98 F (36.7 C)     Temp Source 01/22/16 1922 Oral     SpO2 01/22/16 1922 93 %     Weight 01/22/16 1922 183 lb (83.008 kg)     Height 01/22/16 1922  (1.676 m)     Head Cir --      Peak Flow --      Pain Score 01/22/16 1924 5     Pain Loc --      Pain Edu? --      Excl. in GC? --      Constitutional: Alert and oriented. No acute distress Eyes: Conjunctivae are normal. No erythema or injection ENT   Head: Normocephalic and atraumatic.   Mouth/Throat: Mucous membranes are moist. Cardiovascular: Normal rate, regular rhythm. Normal and symmetric distal pulses are present in the upper extremities.  Respiratory: Normal respiratory effort  without tachypnea nor retractions. Breath sounds are clear and equal bilaterally.  Gastrointestinal: Soft and non-tender in all quadrants. No distention. There is no CVA tenderness. Genitourinary: deferred Musculoskeletal: Patient with right lateral hip tenderness to palpation. Significant pain with axial load of the right leg. 2+ distal pulses. Sensation intact. Compartment soft. Neurologic:  Normal speech and language. No gross focal neurologic deficits are appreciated. Skin:  Skin is warm, dry and intact. No rash noted. Psychiatric: Mood and affect are normal. Patient exhibits appropriate insight and judgment.  ____________________________________________    LABS (pertinent positives/negatives)  Labs Reviewed  BASIC METABOLIC PANEL - Abnormal; Notable for the following:    Glucose, Bld 105 (*)    BUN 23 (*)    All other components within normal limits  CBC WITH DIFFERENTIAL/PLATELET - Abnormal; Notable for the following:    Lymphs Abs 0.9 (*)    All other components within normal limits  PROTIME-INR - Abnormal; Notable for the following:    Prothrombin Time 23.4 (*)    All other components within normal limits  URINALYSIS COMPLETEWITH MICROSCOPIC ([facility] ONLY)  TYPE AND SCREEN  ABO/RH    ____________________________________________   EKG  None  ____________________________________________    RADIOLOGY  Right femoral neck fracture  ____________________________________________   PROCEDURES  Procedure(s) performed: none  Critical Care performed: none  ____________________________________________   INITIAL IMPRESSION / ASSESSMENT AND PLAN / ED COURSE  Pertinent labs & imaging results that were available during my care of the patient were reviewed by me and considered in my medical decision making (see chart for details).  Patient presents after falling to the right hip. Exam is concerning for hip fracture versus possible pelvic fracture. We will obtain  x-rays, give pain medication and reevaluate.  ----------------------------------------- 9:22 PM on 01/22/2016 -----------------------------------------  Patient with right femoral neck fracture. Discussed with Dr. Joice Lofts he will take her to the operating room in the morning. We will admit to internal medicine at  this time  ____________________________________________   FINAL CLINICAL IMPRESSION(S) / ED DIAGNOSES  Final diagnoses:  Closed fracture of neck of right femur, initial encounter (HCC)          Jene Every, MD 01/22/16 2124

## 2016-01-22 NOTE — ED Notes (Signed)
Pt. States she fell at around 5:30 today due to hurting rt. Knee.  Pt. States she fell on her right side.  Pt. States pain to rt. Hip and pelvis.  Pt. Denies LOC.

## 2016-01-22 NOTE — ED Notes (Signed)
Patient transported to X-ray 

## 2016-01-22 NOTE — ED Notes (Signed)
Pt returned from xray

## 2016-01-23 ENCOUNTER — Inpatient Hospital Stay
Admit: 2016-01-23 | Discharge: 2016-01-23 | Disposition: A | Discharge status: Home / Self Care | Payer: Medicare Other | Attending: Family Medicine | Admitting: Family Medicine

## 2016-01-23 DIAGNOSIS — E119 Type 2 diabetes mellitus without complications: Secondary | ICD-10-CM | POA: Diagnosis present

## 2016-01-23 DIAGNOSIS — Z7901 Long term (current) use of anticoagulants: Secondary | ICD-10-CM | POA: Diagnosis not present

## 2016-01-23 DIAGNOSIS — W010XXA Fall on same level from slipping, tripping and stumbling without subsequent striking against object, initial encounter: Secondary | ICD-10-CM | POA: Diagnosis present

## 2016-01-23 DIAGNOSIS — E039 Hypothyroidism, unspecified: Secondary | ICD-10-CM

## 2016-01-23 DIAGNOSIS — I110 Hypertensive heart disease with heart failure: Secondary | ICD-10-CM | POA: Diagnosis present

## 2016-01-23 DIAGNOSIS — I2510 Atherosclerotic heart disease of native coronary artery without angina pectoris: Secondary | ICD-10-CM | POA: Diagnosis present

## 2016-01-23 DIAGNOSIS — Y92120 Kitchen in nursing home as the place of occurrence of the external cause: Secondary | ICD-10-CM | POA: Diagnosis not present

## 2016-01-23 DIAGNOSIS — S7290XA Unspecified fracture of unspecified femur, initial encounter for closed fracture: Secondary | ICD-10-CM | POA: Diagnosis present

## 2016-01-23 DIAGNOSIS — I4891 Unspecified atrial fibrillation: Secondary | ICD-10-CM | POA: Diagnosis present

## 2016-01-23 DIAGNOSIS — Z79899 Other long term (current) drug therapy: Secondary | ICD-10-CM | POA: Diagnosis not present

## 2016-01-23 DIAGNOSIS — S72001A Fracture of unspecified part of neck of right femur, initial encounter for closed fracture: Secondary | ICD-10-CM | POA: Diagnosis present

## 2016-01-23 DIAGNOSIS — H3530 Unspecified macular degeneration: Secondary | ICD-10-CM

## 2016-01-23 DIAGNOSIS — I10 Essential (primary) hypertension: Secondary | ICD-10-CM

## 2016-01-23 DIAGNOSIS — Z7984 Long term (current) use of oral hypoglycemic drugs: Secondary | ICD-10-CM | POA: Diagnosis not present

## 2016-01-23 DIAGNOSIS — I5023 Acute on chronic systolic (congestive) heart failure: Secondary | ICD-10-CM | POA: Diagnosis present

## 2016-01-23 LAB — BASIC METABOLIC PANEL
Anion gap: 8 (ref 5–15)
BUN: 21 mg/dL — AB (ref 6–20)
CALCIUM: 8.7 mg/dL — AB (ref 8.9–10.3)
CO2: 25 mmol/L (ref 22–32)
CREATININE: 0.92 mg/dL (ref 0.44–1.00)
Chloride: 104 mmol/L (ref 101–111)
GFR calc non Af Amer: 54 mL/min — ABNORMAL LOW (ref >60)
Glucose, Bld: 160 mg/dL — ABNORMAL HIGH (ref 65–99)
Potassium: 4.5 mmol/L (ref 3.5–5.1)
SODIUM: 137 mmol/L (ref 135–145)

## 2016-01-23 LAB — SURGICAL PCR SCREEN
MRSA, PCR: NEGATIVE
Staphylococcus aureus: NEGATIVE

## 2016-01-23 LAB — CBC
HCT: 39.5 % (ref 35.0–47.0)
Hemoglobin: 12.8 g/dL (ref 12.0–16.0)
MCH: 31.4 pg (ref 26.0–34.0)
MCHC: 32.3 g/dL (ref 32.0–36.0)
MCV: 97.2 fL (ref 80.0–100.0)
PLATELETS: 174 10*3/uL (ref 150–440)
RBC: 4.06 MIL/uL (ref 3.80–5.20)
RDW: 14.7 % — AB (ref 11.5–14.5)
WBC: 8.6 10*3/uL (ref 3.6–11.0)

## 2016-01-23 LAB — PROTIME-INR
INR: 2.39
INR: 1.82
PROTHROMBIN TIME: 25.8 s — AB (ref 11.4–15.0)
Prothrombin Time: 21 seconds — ABNORMAL HIGH (ref 11.4–15.0)

## 2016-01-23 LAB — GLUCOSE, CAPILLARY
GLUCOSE-CAPILLARY: 171 mg/dL — AB (ref 65–99)
GLUCOSE-CAPILLARY: 130 mg/dL — AB (ref 65–99)
GLUCOSE-CAPILLARY: 142 mg/dL — AB (ref 65–99)
Glucose-Capillary: 133 mg/dL — ABNORMAL HIGH (ref 65–99)

## 2016-01-23 MED ORDER — METOPROLOL SUCCINATE ER 50 MG PO TB24
150.0000 mg | ORAL_TABLET | Freq: Every day | ORAL | Status: DC
Start: 2016-01-23 — End: 2016-01-26
  Administered 2016-01-23 – 2016-01-26 (×4): 150 mg via ORAL
  Filled 2016-01-23: qty 1
  Filled 2016-01-23 (×3): qty 3

## 2016-01-23 MED ORDER — RAMIPRIL 5 MG PO CAPS
5.0000 mg | ORAL_CAPSULE | Freq: Every day | ORAL | Status: DC
  Administered 2016-01-23 – 2016-01-24 (×2): 5 mg via ORAL
  Filled 2016-01-23 (×3): qty 1

## 2016-01-23 MED ORDER — LEVOTHYROXINE SODIUM 100 MCG PO TABS
100.0000 ug | ORAL_TABLET | Freq: Every day | ORAL | Status: DC
  Administered 2016-01-23 – 2016-01-25 (×3): 100 ug via ORAL
  Filled 2016-01-23 (×3): qty 1

## 2016-01-23 MED ORDER — ACETAMINOPHEN 325 MG PO TABS: 650.0000 mg | ORAL_TABLET | Freq: Four times a day (QID) | ORAL | Status: DC | PRN

## 2016-01-23 MED ORDER — VITAMIN K1 10 MG/ML IJ SOLN
1.0000 mg | Freq: Once | INTRAVENOUS | Status: AC
  Administered 2016-01-23: 1 mg via INTRAVENOUS
  Filled 2016-01-23: qty 0.1

## 2016-01-23 MED ORDER — INSULIN ASPART 100 UNIT/ML ~~LOC~~ SOLN
0.0000 [IU] | Freq: Every day | SUBCUTANEOUS | Status: DC
  Filled 2016-01-23: qty 2

## 2016-01-23 MED ORDER — CHLORHEXIDINE GLUCONATE CLOTH 2 % EX PADS
6.0000 | MEDICATED_PAD | Freq: Every day | CUTANEOUS | Status: DC
  Administered 2016-01-23 – 2016-01-25 (×3): 6 via TOPICAL

## 2016-01-23 MED ORDER — BISACODYL 5 MG PO TBEC
5.0000 mg | DELAYED_RELEASE_TABLET | Freq: Every day | ORAL | Status: DC | PRN
  Administered 2016-01-24 – 2016-01-25 (×2): 5 mg via ORAL
  Filled 2016-01-23 (×2): qty 1

## 2016-01-23 MED ORDER — INSULIN ASPART 100 UNIT/ML ~~LOC~~ SOLN
0.0000 [IU] | Freq: Three times a day (TID) | SUBCUTANEOUS | Status: DC
  Administered 2016-01-23 – 2016-01-24 (×5): 2 [IU] via SUBCUTANEOUS
  Administered 2016-01-25: 3 [IU] via SUBCUTANEOUS
  Administered 2016-01-25: 2 [IU] via SUBCUTANEOUS
  Administered 2016-01-25 – 2016-01-26 (×2): 3 [IU] via SUBCUTANEOUS
  Filled 2016-01-23 (×3): qty 2
  Filled 2016-01-23: qty 3
  Filled 2016-01-23 (×3): qty 2

## 2016-01-23 MED ORDER — ACETAMINOPHEN 650 MG RE SUPP: 650.0000 mg | Freq: Four times a day (QID) | RECTAL | Status: DC | PRN

## 2016-01-23 MED ORDER — CITALOPRAM HYDROBROMIDE 20 MG PO TABS
10.0000 mg | ORAL_TABLET | Freq: Every day | ORAL | Status: DC
  Administered 2016-01-23 – 2016-01-26 (×4): 10 mg via ORAL
  Filled 2016-01-23 (×5): qty 1

## 2016-01-23 MED ORDER — SODIUM CHLORIDE 0.9 % IV SOLN
INTRAVENOUS | Status: DC
  Administered 2016-01-23: 02:00:00 via INTRAVENOUS

## 2016-01-23 MED ORDER — DILTIAZEM HCL 25 MG/5ML IV SOLN
5.0000 mg | Freq: Once | INTRAVENOUS | Status: AC
  Administered 2016-01-23: 5 mg via INTRAVENOUS
  Filled 2016-01-23: qty 5

## 2016-01-23 MED ORDER — FUROSEMIDE 10 MG/ML IJ SOLN
20.0000 mg | Freq: Once | INTRAMUSCULAR | Status: AC
  Administered 2016-01-23: 20 mg via INTRAVENOUS
  Filled 2016-01-23: qty 2

## 2016-01-23 MED ORDER — GLIMEPIRIDE 2 MG PO TABS: 2.0000 mg | ORAL_TABLET | ORAL | Status: DC

## 2016-01-23 NOTE — Progress Notes (Signed)
[facility] was updated on care. Arsenio LoaderFriend judith was updated of admission.

## 2016-01-23 NOTE — Progress Notes (Signed)
Chlorhexidine gluconate bath given to the patient due to surgical procedure.  MRSA by PCR order in, pt. Nares swabbed and awaiting for result.  No acute distress noted. Patient exhibits pain with movement but refused pain medication. Will continue to monitor.

## 2016-01-23 NOTE — Progress Notes (Signed)
Subjective: Admitted with hip fx.  Objective: Vital signs in last 24 hours: Temp:  [98 F (36.7 C)-98.5 F (36.9 C)] 98.4 F (36.9 C) (05/28 1217) Pulse Rate:  [54-154] 87 (05/28 1217) Resp:  [16-18] 18 (05/28 1217) BP: (91-134)/(58-97) 134/80 mmHg (05/28 1217) SpO2:  [86 %-98 %] 96 % (05/28 1217) Weight:  [82.101 kg (181 lb)-83.008 kg (183 lb)] 82.101 kg (181 lb) (05/28 0150) Weight change:     Intake/Output from previous day: 05/27 0701 - 05/28 0700 In: 198.8 [I.V.:198.8] Out: 400 [Urine:400] Intake/Output this shift: Total I/O In: -  Out: 1000 [Urine:1000]  General appearance: alert and no distress Head: Normocephalic, without obvious abnormality, atraumatic Resp: clear to auscultation bilaterally and normal percussion bilaterally Cardio: irregularly irregular rhythm GI: soft, non-tender; bowel sounds normal; no masses,  no organomegaly Extremities: extremities normal, atraumatic, no cyanosis or edema Neurologic: Grossly normal  Lab Results:  Recent Labs  01/22/16 2025 01/23/16 0442  WBC 8.1 8.6  HGB 13.2 12.8  HCT 40.2 39.5  PLT 179 174   BMET  Recent Labs  01/22/16 2025 01/23/16 0442  NA 139 137  K 4.5 4.5  CL 106 104  CO2 25 25  GLUCOSE 105* 160*  BUN 23* 21*  CREATININE 0.83 0.92  CALCIUM 9.0 8.7*    Studies/Results: Dg Chest 1 View  01/22/2016  CLINICAL DATA:  Preop, right hip fracture. EXAM: CHEST 1 VIEW COMPARISON:  PA and lateral views 01/16/2012 FINDINGS: Go cardiomegaly has increased from exam 4 years prior. Interstitial prominence suggesting pulmonary edema has also increased. The lungs are mildly hyperinflated. There is blunting of both costophrenic angles. Linear opacity in the left midlung zone is likely atelectasis or scarring. No confluent airspace disease or pneumothorax. No displaced rib fracture. IMPRESSION: 1. Cardiomegaly, progressed from most recent comparison exam 4 years prior. Increased interstitial markings, likely pulmonary  edema. Findings suggestive of low-grade CHF 2. Blunting of the costophrenic angles may be small effusions or secondary to hyperinflation. Electronically Signed   By: Rubye OaksMelanie  Ehinger M.D.   On: 01/22/2016 21:08   Dg Hip Unilat With Pelvis 2-3 Views Right  01/22/2016  CLINICAL DATA:  Right hip pain after fall from standing.  Injury. EXAM: DG HIP (WITH OR WITHOUT PELVIS) 2-3V RIGHT COMPARISON:  None. FINDINGS: Displaced right femoral neck fracture with proximal migration of the femoral shaft. No significant angulation. Femoral head remains seated. No additional fracture of the pelvis or left hip. The bones are under mineralized. IMPRESSION: Displaced right femoral neck fracture. Electronically Signed   By: Rubye OaksMelanie  Ehinger M.D.   On: 01/22/2016 21:06    Medications: Continuous:  WUJ:[id number]:acetaminophen **OR** acetaminophen, bisacodyl, morphine injection  Assessment/Plan: 1. Hip Fracture: Plan is for surgery tomorrow per ortho if INR down. Also will need to review echo results. 2. Pulmonary Edema: Found on cxr. Patient is asymptomatic.Marland Kitchen.Echo has been performed with results pending. Will need to follow up on results and adjust meds as needed. Patient given lasix last night and lungs sound clear today. 3. Atrial Fib: Rate controlled. Holding coumadin for procedure. 4. Hypothyriodism: On synthroid.  Time spent= 25 min    LOS: 0 days   Gracelyn NurseJohnston,  John D 01/23/2016, 4:52 PM

## 2016-01-23 NOTE — Progress Notes (Signed)
md Stephanie Welch was made aware of hr 140, stated ok to send to 1A

## 2016-01-23 NOTE — H&P (Signed)
PCP:    Chief Complaint:  Fall  HPI: This is a very pleasant 82 year old female who states she was walking in her kitchen today with her cane when she developed pain in her left knee. The patient was severe enough it causes her to go down. She hit her right hip. She states she did not hit anything else. She was unable to stand. She lives in assisted care center was able to call first help and she was brought to the ER. She denies any history of coronary artery disease, chest pains with activity. She denies any shortness of breath. Her last surgery was approximately 30 years ago. She has not had any recent stress test. Patient is a very good historian, when asked what her diagnosis is therefore isn't answering, stating she is on a lot of medications which she is just given over to the nurses to give her.   Review of Systems:  The patient denies anorexia, fever, weight loss,, vision loss, decreased hearing, hoarseness, chest pain, syncope, dyspnea on exertion, peripheral edema, balance deficits, hemoptysis, abdominal pain, melena, hematochezia, severe indigestion/heartburn, hematuria, incontinence, genital sores, muscle weakness, suspicious skin lesions, transient blindness, difficulty walking, fall, depression, unusual weight change, abnormal bleeding, enlarged lymph nodes, angioedema, and breast masses.  Past Medical History: Past Medical History  Diagnosis Date  . Thyroid disease   . Hypertension   . Diabetes mellitus without complication (HCC)   . Macular degeneration   . Coronary artery disease    Past Surgical History  Procedure Laterality Date  . Abdominal hysterectomy    . Fracture surgery      Medications: Prior to Admission medications   Medication Sig Start Date End Date Taking? Authorizing Provider  citalopram (CELEXA) 10 MG tablet Take 10 mg by mouth daily. 12/23/15  Yes Historical Provider, MD  COUMADIN 2 MG tablet Take 4-6 mg by mouth See admin instructions. Take 2 tablets  (4mg ) by mouth on Monday, Tuesday, Wednesday, and Thursday. Take 3 tablets (6mg ) by mouth on Friday, Saturday, and Sunday 12/23/15  Yes Historical Provider, MD  furosemide (LASIX) 20 MG tablet Take 20 mg by mouth daily. 12/23/15  Yes Historical Provider, MD  glimepiride (AMARYL) 2 MG tablet Take 2 mg by mouth every morning. 12/23/15  Yes Historical Provider, MD  levothyroxine (SYNTHROID, LEVOTHROID) 100 MCG tablet Take 100 mcg by mouth daily before breakfast. 01/13/16  Yes Historical Provider, MD  metFORMIN (GLUCOPHAGE) 500 MG tablet Take 500 mg by mouth 2 (two) times daily. 12/23/15  Yes Historical Provider, MD  metoprolol succinate (TOPROL-XL) 50 MG 24 hr tablet Take 150 mg by mouth daily with breakfast.  12/23/15  Yes Historical Provider, MD  Multiple Vitamins-Minerals (OCUVITE-LUTEIN PO) Take 1 tablet by mouth every morning.   Yes Historical Provider, MD  ramipril (ALTACE) 5 MG capsule Take 5 mg by mouth daily. 12/23/15  Yes Historical Provider, MD  oxyCODONE-acetaminophen (PERCOCET) 5-325 MG tablet Take 2 tablets by mouth every 6 (six) hours as needed for moderate pain or severe pain. 10/20/15   Emily FilbertJonathan E Williams, MD    Allergies:   Allergies  Allergen Reactions  . Conjugated Estrogens     Other reaction(s): Unknown    Social History:  reports that she has never smoked. She does not have any smokeless tobacco history on file. She reports that she does not drink alcohol. Her drug history is not on file.  Family History: Noncontributory  Physical Exam: Filed Vitals:   01/22/16 2230 01/22/16 2245 01/22/16 2300 01/23/16  0000  BP: 130/58  115/79 132/82  Pulse: 100 54 88 119  Temp:      TempSrc:      Resp:      Height:      Weight:      SpO2: 87% 86% 89% 91%    General:  Alert and oriented times three, well developed and nourished, no acute distress Eyes: PERRLA, pink conjunctiva, no scleral icterus ENT: Moist oral mucosa, neck supple, no thyromegaly Lungs: clear to ascultation, no  wheeze, no crackles, no use of accessory muscles Cardiovascular: regular rate and rhythm, no regurgitation, no gallops, no murmurs. No carotid bruits, no JVD Abdomen: soft, positive BS, non-tender, non-distended, no organomegaly, not an acute abdomen GU: not examined Neuro: CN II - XII grossly intact, sensation intact Musculoskeletal: strength 5/5 all extremities, Strength in the right lower extremity not assessed, no clubbing, cyanosis or edema Skin: no rash, no subcutaneous crepitation, no decubitus Psych: appropriate patient   Labs on Admission:   Recent Labs  01/22/16 2025  NA 139  K 4.5  CL 106  CO2 25  GLUCOSE 105*  BUN 23*  CREATININE 0.83  CALCIUM 9.0   No results for input(s): AST, ALT, ALKPHOS, BILITOT, PROT, ALBUMIN in the last 72 hours. No results for input(s): LIPASE, AMYLASE in the last 72 hours.  Recent Labs  01/22/16 2025  WBC 8.1  NEUTROABS 6.2  HGB 13.2  HCT 40.2  MCV 95.9  PLT 179   No results for input(s): CKTOTAL, CKMB, CKMBINDEX, TROPONINI in the last 72 hours. Invalid input(s): POCBNP No results for input(s): DDIMER in the last 72 hours. No results for input(s): HGBA1C in the last 72 hours. No results for input(s): CHOL, HDL, LDLCALC, TRIG, CHOLHDL, LDLDIRECT in the last 72 hours. No results for input(s): TSH, T4TOTAL, T3FREE, THYROIDAB in the last 72 hours.  Invalid input(s): FREET3 No results for input(s): VITAMINB12, FOLATE, FERRITIN, TIBC, IRON, RETICCTPCT in the last 72 hours.  Micro Results: No results found for this or any previous visit (from the past 240 hour(s)).   Radiological Exams on Admission: Dg Chest 1 View  01/22/2016  CLINICAL DATA:  Preop, right hip fracture. EXAM: CHEST 1 VIEW COMPARISON:  PA and lateral views 01/16/2012 FINDINGS: Go cardiomegaly has increased from exam 4 years prior. Interstitial prominence suggesting pulmonary edema has also increased. The lungs are mildly hyperinflated. There is blunting of both  costophrenic angles. Linear opacity in the left midlung zone is likely atelectasis or scarring. No confluent airspace disease or pneumothorax. No displaced rib fracture. IMPRESSION: 1. Cardiomegaly, progressed from most recent comparison exam 4 years prior. Increased interstitial markings, likely pulmonary edema. Findings suggestive of low-grade CHF 2. Blunting of the costophrenic angles may be small effusions or secondary to hyperinflation. Electronically Signed   By: Rubye Oaks M.D.   On: 01/22/2016 21:08   Dg Hip Unilat With Pelvis 2-3 Views Right  01/22/2016  CLINICAL DATA:  Right hip pain after fall from standing.  Injury. EXAM: DG HIP (WITH OR WITHOUT PELVIS) 2-3V RIGHT COMPARISON:  None. FINDINGS: Displaced right femoral neck fracture with proximal migration of the femoral shaft. No significant angulation. Femoral head remains seated. No additional fracture of the pelvis or left hip. The bones are under mineralized. IMPRESSION: Displaced right femoral neck fracture. Electronically Signed   By: Rubye Oaks M.D.   On: 01/22/2016 21:06    Assessment/Plan Present on Admission:  . Right Femur fracture (HCC) -Admit to MedSurg -For any fracture due  to mechanical cause  -Orthopedics consulted and aware, plans are for in a.m. Patient nothing by mouth at midnight -Pain medications as needed  Possible congestive heart failure -Chest x-ray suggestive of congestive heart failure, we will order 2-D echo for the a.m. and IV Lasix -Consult cardiology in a.m. for cardiac clearance  Hypothyroidism - Stable, resume home medications  Diabetes mellitus type 2 -Nothing by mouth at midnight -Sliding-scale insulin every 6 hours  Coumadin -Unclear reasons for the medications. We'll order INR.  Macular degeneration -Stable, aware  Hypertension -Stable, resume home medications -When necessary blood pressure medications ordered   Stephanie Welch 01/23/2016, 12:35 AM

## 2016-01-23 NOTE — Progress Notes (Signed)
Room air. A fib. FS are stable. Pt has not complained of any pain. A & O. Takes meds ok. Foley. Prep for R hip hemiarthoplasty 5/29. Pt has no further concerns at this time. Pt to tx to 136 report called to Yahoo! IncKim RN

## 2016-01-23 NOTE — Consult Note (Signed)
ORTHOPAEDIC CONSULTATION  REQUESTING PHYSICIAN: Gracelyn NurseJohn D Johnston, MD  Chief Complaint:   Right hip pain.  History of Present Illness: Stephanie KiefJean E Welch is a 82 y.o. female with a history of adult-onset diabetes, coronary artery disease, hypertension, and thyroid disease who lives in an assisted living facility. Apparently, she lost her balance in her kitchen and landed on her right hip. She was unable to ambulate, so she was brought to the emergency room where x-rays demonstrated a displaced right femoral neck fracture. She also was noted to be in mild congestive heart failure and had new onset atrial fibrillation. She is on Coumadin and her INR on admission was 2.4. These conditions will need to be addressed before we can take her to surgery. Therefore, the patient has been admitted to the medicine service for medical optimization prior to definitive management of her right hip injury.  Past Medical History  Diagnosis Date  . Thyroid disease   . Hypertension   . Diabetes mellitus without complication (HCC)   . Macular degeneration   . Coronary artery disease    Past Surgical History  Procedure Laterality Date  . Abdominal hysterectomy    . Fracture surgery     Social History   Social History  . Marital Status: Single    Spouse Name: N/A  . Number of Children: N/A  . Years of Education: N/A   Social History Main Topics  . Smoking status: Never Smoker   . Smokeless tobacco: None  . Alcohol Use: No  . Drug Use: None  . Sexual Activity: Not Asked   Other Topics Concern  . None   Social History Narrative   No family history on file. Allergies  Allergen Reactions  . Conjugated Estrogens     Other reaction(s): Unknown   Prior to Admission medications   Medication Sig Start Date End Date Taking? Authorizing Provider  citalopram (CELEXA) 10 MG tablet Take 10 mg by mouth daily. 12/23/15  Yes Historical Provider, MD   COUMADIN 2 MG tablet Take 4-6 mg by mouth See admin instructions. Take 2 tablets (4mg ) by mouth on Monday, Tuesday, Wednesday, and Thursday. Take 3 tablets (6mg ) by mouth on Friday, Saturday, and Sunday 12/23/15  Yes Historical Provider, MD  furosemide (LASIX) 20 MG tablet Take 20 mg by mouth daily. 12/23/15  Yes Historical Provider, MD  glimepiride (AMARYL) 2 MG tablet Take 2 mg by mouth every morning. 12/23/15  Yes Historical Provider, MD  levothyroxine (SYNTHROID, LEVOTHROID) 100 MCG tablet Take 100 mcg by mouth daily before breakfast. 01/13/16  Yes Historical Provider, MD  metFORMIN (GLUCOPHAGE) 500 MG tablet Take 500 mg by mouth 2 (two) times daily. 12/23/15  Yes Historical Provider, MD  metoprolol succinate (TOPROL-XL) 50 MG 24 hr tablet Take 150 mg by mouth daily with breakfast.  12/23/15  Yes Historical Provider, MD  Multiple Vitamins-Minerals (OCUVITE-LUTEIN PO) Take 1 tablet by mouth every morning.   Yes Historical Provider, MD  ramipril (ALTACE) 5 MG capsule Take 5 mg by mouth daily. 12/23/15  Yes Historical Provider, MD  oxyCODONE-acetaminophen (PERCOCET) 5-325 MG tablet Take 2 tablets by mouth every 6 (six) hours as needed for moderate pain or severe pain. 10/20/15   Emily FilbertJonathan E Williams, MD   Dg Chest 1 View  01/22/2016  CLINICAL DATA:  Preop, right hip fracture. EXAM: CHEST 1 VIEW COMPARISON:  PA and lateral views 01/16/2012 FINDINGS: Go cardiomegaly has increased from exam 4 years prior. Interstitial prominence suggesting pulmonary edema has also increased. The lungs  are mildly hyperinflated. There is blunting of both costophrenic angles. Linear opacity in the left midlung zone is likely atelectasis or scarring. No confluent airspace disease or pneumothorax. No displaced rib fracture. IMPRESSION: 1. Cardiomegaly, progressed from most recent comparison exam 4 years prior. Increased interstitial markings, likely pulmonary edema. Findings suggestive of low-grade CHF 2. Blunting of the costophrenic  angles may be small effusions or secondary to hyperinflation. Electronically Signed   By: Rubye Oaks M.D.   On: 01/22/2016 21:08   Dg Hip Unilat With Pelvis 2-3 Views Right  01/22/2016  CLINICAL DATA:  Right hip pain after fall from standing.  Injury. EXAM: DG HIP (WITH OR WITHOUT PELVIS) 2-3V RIGHT COMPARISON:  None. FINDINGS: Displaced right femoral neck fracture with proximal migration of the femoral shaft. No significant angulation. Femoral head remains seated. No additional fracture of the pelvis or left hip. The bones are under mineralized. IMPRESSION: Displaced right femoral neck fracture. Electronically Signed   By: Rubye Oaks M.D.   On: 01/22/2016 21:06    Positive ROS: All other systems have been reviewed and were otherwise negative with the exception of those mentioned in the HPI and as above.  Physical Exam: General:  Alert, no acute distress Psychiatric:  Patient is competent for consent with normal mood and affect   Cardiovascular:  No pedal edema Respiratory:  No wheezing, non-labored breathing GI:  Abdomen is soft and non-tender Skin:  No lesions in the area of chief complaint Neurologic:  Sensation intact distally Lymphatic:  No axillary or cervical lymphadenopathy  Orthopedic Exam:  Orthopedic examination is limited to the right hip and lower extremity. Skin inspection around the right hip is unremarkable. She has only minimal tenderness to palpation over the trochanteric region laterally. She has moderate to severe pain with any attempted active passive motion of the hip. She is able to actively dorsiflex and plantarflex her toes and ankle. Sensation is intact to light touch daughter patient's. She has good capillary refill to her right foot.  X-rays:  X-rays of the pelvis and right hip are available for review. The findings are as described above. No significant degenerative changes are noted. No lytic lesions or other abnormalities are  identified.  Assessment: Displaced right femoral neck fracture.  Plan: The treatment options were discussed with the patient, including both nonsurgical and surgical options. The patient would like to proceed with a right hip hemiarthroplasty. This procedure has been discussed in detail with the patient, as have the potential risks (including bleeding, infection, nerve and/or blood vessel injury, persistent or recurrent pain, stiffness, dislocation, leg length inequality, loosening of and/or failure of the components, need for further surgery, blood clots, strokes, heart attacks and/or arrhythmias, etc.) and benefits. Patient states her understanding and wishes to proceed. A formal written consent will be obtained by the nursing staff.   We will proceed with scheduling this patient once her PT/INR has been corrected and she has been cleared medically. I have ordered a dose of vitamin K IV to help reverse her Coumadin. An echocardiogram also has been ordered for this morning. Therefore, most likely her surgery will be tomorrow morning.  Thank you for asking me to participate in the care of this pleasant woman. I will be happy to follow her with you.    Maryagnes Amos, MD  Beeper #:  [phone]  01/23/2016 11:28 AM

## 2016-01-23 NOTE — Progress Notes (Signed)
Patient has been Afib since admission  around 0200 and she does not have any history of it. HR fluctuating from 86 -120.  Dr. Tobi BastosPyreddy notified but no new order received. Will continue to monitor.

## 2016-01-23 NOTE — ED Notes (Signed)
Pt laying in bed A&O X4 - she requested the TV be turned on so she could listen to the news - reports relief of pain at this time

## 2016-01-23 NOTE — Progress Notes (Signed)
Patient is admitted to room 233 with the diagnosis of femur fracture. Alert and oriented x 4. Noted blurry vision on bilateral eyes. Patient oriented to her room, staff and call bell/ascom. Tele box verified by Sena Hitchakera RN and Marchelle FolksAmanda NT. Skin assessment done with Deatra Jamesakera RN. No skin issues with the exception of ecchymosis on bilateral arms.  Informed consent signed for ORIF. Patient remain NPO for the surgery. Patient denied pain at this time. No acute respiratory distress noted. Will continue to monitor.

## 2016-01-24 ENCOUNTER — Inpatient Hospital Stay: Payer: Medicare Other | Admitting: Anesthesiology

## 2016-01-24 ENCOUNTER — Inpatient Hospital Stay: Payer: Medicare Other

## 2016-01-24 ENCOUNTER — Encounter
Admission: EM | Disposition: A | Discharge status: Skilled Nursing Facility | Payer: Self-pay | Source: Home / Self Care | Attending: Internal Medicine

## 2016-01-24 HISTORY — PX: HIP ARTHROPLASTY: SHX981

## 2016-01-24 LAB — GLUCOSE, CAPILLARY
GLUCOSE-CAPILLARY: 117 mg/dL — AB (ref 65–99)
GLUCOSE-CAPILLARY: 136 mg/dL — AB (ref 65–99)
GLUCOSE-CAPILLARY: 138 mg/dL — AB (ref 65–99)
GLUCOSE-CAPILLARY: 156 mg/dL — AB (ref 65–99)
Glucose-Capillary: 147 mg/dL — ABNORMAL HIGH (ref 65–99)

## 2016-01-24 LAB — CBC WITH DIFFERENTIAL/PLATELET
Basophils Absolute: 0 10*3/uL (ref 0–0.1)
Basophils Relative: 1 %
Eosinophils Absolute: 0.1 10*3/uL (ref 0–0.7)
Eosinophils Relative: 1 %
HEMATOCRIT: 38.6 % (ref 35.0–47.0)
HEMOGLOBIN: 12.6 g/dL (ref 12.0–16.0)
LYMPHS ABS: 1.1 10*3/uL (ref 1.0–3.6)
Lymphocytes Relative: 12 %
MCH: 32 pg (ref 26.0–34.0)
MCHC: 32.8 g/dL (ref 32.0–36.0)
MCV: 97.5 fL (ref 80.0–100.0)
Monocytes Absolute: 0.6 10*3/uL (ref 0.2–0.9)
NEUTROS ABS: 6.7 10*3/uL — AB (ref 1.4–6.5)
Platelets: 151 10*3/uL (ref 150–440)
RBC: 3.95 MIL/uL (ref 3.80–5.20)
RDW: 14.7 % — ABNORMAL HIGH (ref 11.5–14.5)
WBC: 8.5 10*3/uL (ref 3.6–11.0)

## 2016-01-24 LAB — BASIC METABOLIC PANEL
Anion gap: 7 (ref 5–15)
BUN: 21 mg/dL — AB (ref 6–20)
CHLORIDE: 104 mmol/L (ref 101–111)
CO2: 25 mmol/L (ref 22–32)
CREATININE: 0.89 mg/dL (ref 0.44–1.00)
Calcium: 8.6 mg/dL — ABNORMAL LOW (ref 8.9–10.3)
GFR calc Af Amer: >60 mL/min (ref >60)
GFR calc non Af Amer: 57 mL/min — ABNORMAL LOW (ref >60)
Glucose, Bld: 145 mg/dL — ABNORMAL HIGH (ref 65–99)
Potassium: 3.8 mmol/L (ref 3.5–5.1)
Sodium: 136 mmol/L (ref 135–145)

## 2016-01-24 LAB — PROTIME-INR
INR: 1.46
Prothrombin Time: 17.8 seconds — ABNORMAL HIGH (ref 11.4–15.0)

## 2016-01-24 LAB — ECHOCARDIOGRAM COMPLETE
Height: 66 in
Weight: 2896 oz

## 2016-01-24 SURGERY — HEMIARTHROPLASTY, HIP, DIRECT ANTERIOR APPROACH, FOR FRACTURE
Anesthesia: General | Laterality: Right | Wound class: Clean

## 2016-01-24 MED ORDER — ONDANSETRON HCL 4 MG/2ML IJ SOLN: 4.0000 mg | Freq: Four times a day (QID) | INTRAMUSCULAR | Status: DC | PRN

## 2016-01-24 MED ORDER — CEFAZOLIN SODIUM 1 G IJ SOLR
INTRAMUSCULAR | Status: DC | PRN
  Administered 2016-01-24: 2 g via INTRAMUSCULAR

## 2016-01-24 MED ORDER — DIPHENHYDRAMINE HCL 12.5 MG/5ML PO ELIX
12.5000 mg | ORAL_SOLUTION | ORAL | Status: DC | PRN
  Filled 2016-01-24: qty 10

## 2016-01-24 MED ORDER — FLEET ENEMA 7-19 GM/118ML RE ENEM: 1.0000 | ENEMA | Freq: Once | RECTAL | Status: DC | PRN

## 2016-01-24 MED ORDER — PANTOPRAZOLE SODIUM 40 MG PO TBEC
40.0000 mg | DELAYED_RELEASE_TABLET | Freq: Two times a day (BID) | ORAL | Status: DC
  Administered 2016-01-24 – 2016-01-26 (×5): 40 mg via ORAL
  Filled 2016-01-24 (×5): qty 1

## 2016-01-24 MED ORDER — CEFAZOLIN SODIUM-DEXTROSE 2-4 GM/100ML-% IV SOLN
2.0000 g | Freq: Four times a day (QID) | INTRAVENOUS | Status: AC
  Administered 2016-01-24 – 2016-01-25 (×3): 2 g via INTRAVENOUS
  Filled 2016-01-24 (×3): qty 100

## 2016-01-24 MED ORDER — HYDROMORPHONE HCL 1 MG/ML IJ SOLN: 0.5000 mg | INTRAMUSCULAR | Status: DC | PRN

## 2016-01-24 MED ORDER — ROCURONIUM BROMIDE 100 MG/10ML IV SOLN
INTRAVENOUS | Status: DC | PRN
  Administered 2016-01-24: 30 mg via INTRAVENOUS
  Administered 2016-01-24: 20 mg via INTRAVENOUS

## 2016-01-24 MED ORDER — FENTANYL CITRATE (PF) 100 MCG/2ML IJ SOLN: 25.0000 ug | INTRAMUSCULAR | Status: DC | PRN

## 2016-01-24 MED ORDER — ONDANSETRON HCL 4 MG/2ML IJ SOLN
INTRAMUSCULAR | Status: DC | PRN
  Administered 2016-01-24: 4 mg via INTRAVENOUS

## 2016-01-24 MED ORDER — FENTANYL CITRATE (PF) 100 MCG/2ML IJ SOLN
INTRAMUSCULAR | Status: DC | PRN
  Administered 2016-01-24 (×3): 50 ug via INTRAVENOUS

## 2016-01-24 MED ORDER — LIDOCAINE HCL (CARDIAC) 20 MG/ML IV SOLN
INTRAVENOUS | Status: DC | PRN
  Administered 2016-01-24: 100 mg via INTRAVENOUS

## 2016-01-24 MED ORDER — PHENYLEPHRINE HCL 10 MG/ML IJ SOLN
INTRAMUSCULAR | Status: DC | PRN
  Administered 2016-01-24 (×5): 100 ug via INTRAVENOUS
  Administered 2016-01-24: 200 ug via INTRAVENOUS
  Administered 2016-01-24: 100 ug via INTRAVENOUS

## 2016-01-24 MED ORDER — KCL IN DEXTROSE-NACL 20-5-0.9 MEQ/L-%-% IV SOLN
INTRAVENOUS | Status: DC
  Administered 2016-01-24 – 2016-01-25 (×2): via INTRAVENOUS
  Filled 2016-01-24 (×4): qty 1000

## 2016-01-24 MED ORDER — BUPIVACAINE-EPINEPHRINE (PF) 0.25% -1:200000 IJ SOLN
INTRAMUSCULAR | Status: DC | PRN
  Administered 2016-01-24: 30 mL via PERINEURAL

## 2016-01-24 MED ORDER — ONDANSETRON HCL 4 MG/2ML IJ SOLN: 4.0000 mg | Freq: Once | INTRAMUSCULAR | Status: DC | PRN

## 2016-01-24 MED ORDER — PROPOFOL 10 MG/ML IV BOLUS
INTRAVENOUS | Status: DC | PRN
  Administered 2016-01-24: 140 mg via INTRAVENOUS

## 2016-01-24 MED ORDER — MAGNESIUM HYDROXIDE 400 MG/5ML PO SUSP: 30.0000 mL | Freq: Every day | ORAL | Status: DC | PRN

## 2016-01-24 MED ORDER — TRANEXAMIC ACID 1000 MG/10ML IV SOLN
INTRAVENOUS | Status: DC | PRN
  Administered 2016-01-24: 1000 mg via TOPICAL

## 2016-01-24 MED ORDER — ENOXAPARIN SODIUM 40 MG/0.4ML ~~LOC~~ SOLN
40.0000 mg | SUBCUTANEOUS | Status: DC
Start: 2016-01-25 — End: 2016-01-26
  Administered 2016-01-25 – 2016-01-26 (×2): 40 mg via SUBCUTANEOUS
  Filled 2016-01-24 (×2): qty 0.4

## 2016-01-24 MED ORDER — SUGAMMADEX SODIUM 200 MG/2ML IV SOLN
INTRAVENOUS | Status: DC | PRN
  Administered 2016-01-24: 175 mg via INTRAVENOUS

## 2016-01-24 MED ORDER — METOCLOPRAMIDE HCL 5 MG/ML IJ SOLN: 5.0000 mg | Freq: Three times a day (TID) | INTRAMUSCULAR | Status: DC | PRN

## 2016-01-24 MED ORDER — SUCCINYLCHOLINE CHLORIDE 20 MG/ML IJ SOLN
INTRAMUSCULAR | Status: DC | PRN
  Administered 2016-01-24: 100 mg via INTRAVENOUS

## 2016-01-24 MED ORDER — NEOMYCIN-POLYMYXIN B GU 40-200000 IR SOLN
Status: DC | PRN
  Administered 2016-01-24: 2 mL

## 2016-01-24 MED ORDER — SODIUM CHLORIDE 0.9 % IV SOLN
INTRAVENOUS | Status: DC | PRN
  Administered 2016-01-24: 09:00:00 via INTRAVENOUS

## 2016-01-24 MED ORDER — ACETAMINOPHEN 325 MG PO TABS: 650.0000 mg | ORAL_TABLET | Freq: Four times a day (QID) | ORAL | Status: DC | PRN

## 2016-01-24 MED ORDER — BISACODYL 10 MG RE SUPP
10.0000 mg | Freq: Every day | RECTAL | Status: DC | PRN
  Administered 2016-01-26: 10 mg via RECTAL
  Filled 2016-01-24: qty 1

## 2016-01-24 MED ORDER — ACETAMINOPHEN 650 MG RE SUPP: 650.0000 mg | Freq: Four times a day (QID) | RECTAL | Status: DC | PRN

## 2016-01-24 MED ORDER — ONDANSETRON HCL 4 MG PO TABS
4.0000 mg | ORAL_TABLET | Freq: Four times a day (QID) | ORAL | Status: DC | PRN
  Administered 2016-01-25: 4 mg via ORAL
  Filled 2016-01-24: qty 1

## 2016-01-24 MED ORDER — METOCLOPRAMIDE HCL 10 MG PO TABS: 5.0000 mg | ORAL_TABLET | Freq: Three times a day (TID) | ORAL | Status: DC | PRN

## 2016-01-24 MED ORDER — OXYCODONE HCL 5 MG PO TABS
5.0000 mg | ORAL_TABLET | ORAL | Status: DC | PRN
  Administered 2016-01-24 – 2016-01-25 (×2): 5 mg via ORAL
  Filled 2016-01-24 (×2): qty 1

## 2016-01-24 MED ORDER — BUPIVACAINE LIPOSOME 1.3 % IJ SUSP
INTRAMUSCULAR | Status: DC | PRN
  Administered 2016-01-24: 20 mL

## 2016-01-24 MED ORDER — DOCUSATE SODIUM 100 MG PO CAPS
100.0000 mg | ORAL_CAPSULE | Freq: Two times a day (BID) | ORAL | Status: DC
  Administered 2016-01-24 – 2016-01-26 (×5): 100 mg via ORAL
  Filled 2016-01-24 (×5): qty 1

## 2016-01-24 SURGICAL SUPPLY — 58 items
BAG DECANTER FOR FLEXI CONT (MISCELLANEOUS) ×3 IMPLANT
BLADE SAGITTAL WIDE XTHICK NO (BLADE) ×3 IMPLANT
BLADE SURG SZ20 CARB STEEL (BLADE) ×3 IMPLANT
BNDG COHESIVE 6X5 TAN STRL LF (GAUZE/BANDAGES/DRESSINGS) ×3 IMPLANT
BOWL CEMENT MIXING ADV NOZZLE (MISCELLANEOUS) ×1 IMPLANT
CANISTER SUCT 1200ML W/VALVE (MISCELLANEOUS) ×3 IMPLANT
CANISTER SUCT 3000ML (MISCELLANEOUS) ×6 IMPLANT
CAPT HIP HEMI 2 ×2 IMPLANT
CHLORAPREP W/TINT 26ML (MISCELLANEOUS) ×10 IMPLANT
DECANTER SPIKE VIAL GLASS SM (MISCELLANEOUS) ×6 IMPLANT
DRAPE IMP U-DRAPE 54X76 (DRAPES) ×6 IMPLANT
DRAPE INCISE IOBAN 66X60 STRL (DRAPES) ×3 IMPLANT
DRAPE SHEET LG 3/4 BI-LAMINATE (DRAPES) ×3 IMPLANT
DRAPE SURG 17X23 STRL (DRAPES) ×3 IMPLANT
DRSG OPSITE POSTOP 4X12 (GAUZE/BANDAGES/DRESSINGS) ×3 IMPLANT
DRSG OPSITE POSTOP 4X14 (GAUZE/BANDAGES/DRESSINGS) ×3 IMPLANT
ELECT BLADE 6.5 EXT (BLADE) ×3 IMPLANT
ELECT CAUTERY BLADE 6.4 (BLADE) ×3 IMPLANT
ELECT REM PT RETURN 9FT ADLT (ELECTROSURGICAL) ×3
ELECTRODE REM PT RTRN 9FT ADLT (ELECTROSURGICAL) ×1 IMPLANT
GAUZE PACK 2X3YD (MISCELLANEOUS) ×3 IMPLANT
GLOVE BIO SURGEON STRL SZ8 (GLOVE) ×6 IMPLANT
GLOVE INDICATOR 8.0 STRL GRN (GLOVE) ×5 IMPLANT
GOWN STRL REUS W/ TWL LRG LVL3 (GOWN DISPOSABLE) ×1 IMPLANT
GOWN STRL REUS W/ TWL XL LVL3 (GOWN DISPOSABLE) ×1 IMPLANT
GOWN STRL REUS W/TWL LRG LVL3 (GOWN DISPOSABLE) ×3
GOWN STRL REUS W/TWL XL LVL3 (GOWN DISPOSABLE) ×3
HANDPIECE SUCTION TUBG SURGILV (MISCELLANEOUS) ×3 IMPLANT
HOOD PEEL AWAY FLYTE STAYCOOL (MISCELLANEOUS) ×8 IMPLANT
IV NS 100ML SINGLE PACK (IV SOLUTION) ×3 IMPLANT
NDL FILTER BLUNT 18X1 1/2 (NEEDLE) ×1 IMPLANT
NDL FRENCH SPRG EYE 1/2 CRC (NEEDLE) ×2 IMPLANT
NDL SAFETY 18GX1.5 (NEEDLE) ×3 IMPLANT
NDL SPNL 20GX3.5 QUINCKE YW (NEEDLE) ×1 IMPLANT
NEEDLE FILTER BLUNT 18X 1/2SAF (NEEDLE) ×2
NEEDLE FILTER BLUNT 18X1 1/2 (NEEDLE) ×1 IMPLANT
NEEDLE SPNL 20GX3.5 QUINCKE YW (NEEDLE) ×3 IMPLANT
NS IRRIG 1000ML POUR BTL (IV SOLUTION) ×3 IMPLANT
PACK HIP PROSTHESIS (MISCELLANEOUS) ×3 IMPLANT
PILLOW ABDUC SM (MISCELLANEOUS) ×3 IMPLANT
SOL .9 NS 3000ML IRR  AL (IV SOLUTION) ×4
SOL .9 NS 3000ML IRR AL (IV SOLUTION) ×2
SOL .9 NS 3000ML IRR UROMATIC (IV SOLUTION) ×2 IMPLANT
STAPLER SKIN PROX 35W (STAPLE) ×3 IMPLANT
STRAP SAFETY BODY (MISCELLANEOUS) ×3 IMPLANT
SUT ETHIBOND #5 BRAIDED 30INL (SUTURE) ×3 IMPLANT
SUT ETHIBOND 2 V 37 (SUTURE) ×6 IMPLANT
SUT ETHIBOND CT1 BRD #0 30IN (SUTURE) ×3 IMPLANT
SUT QUILL PDO 2 24X24 VLT (SUTURE) ×3 IMPLANT
SUT VIC AB 0 CT1 27 (SUTURE) ×6
SUT VIC AB 0 CT1 27XCR 8 STRN (SUTURE) ×2 IMPLANT
SUT VIC AB 1 CT1 36 (SUTURE) ×3 IMPLANT
SUT VIC AB 2-0 CT1 27 (SUTURE) ×6
SUT VIC AB 2-0 CT1 TAPERPNT 27 (SUTURE) ×2 IMPLANT
SYR 30ML LL (SYRINGE) ×3 IMPLANT
SYR TB 1ML 27GX1/2 LL (SYRINGE) ×3 IMPLANT
SYRINGE 10CC LL (SYRINGE) ×3 IMPLANT
TAPE TRANSPORE STRL 2 31045 (GAUZE/BANDAGES/DRESSINGS) ×3 IMPLANT

## 2016-01-24 NOTE — Transfer of Care (Signed)
Immediate Anesthesia Transfer of Care Note  Patient: Stephanie KiefJean E Marshman  Procedure(s) Performed: Procedure(s): ARTHROPLASTY BIPOLAR HIP (HEMIARTHROPLASTY) (Right)  Patient Location: PACU  Anesthesia Type:General  Level of Consciousness: sedated  Airway & Oxygen Therapy: Patient connected to face mask oxygen  Post-op Assessment: Post -op Vital signs reviewed and stable  Post vital signs: stable  Last Vitals:  Filed Vitals:   01/24/16 1108 01/24/16 1110  BP:  103/65  Pulse:  80  Temp: 36.1 C   Resp:  16    Last Pain:  Filed Vitals:   01/24/16 1111  PainSc: 0-No pain         Complications: No apparent anesthesia complications

## 2016-01-24 NOTE — Care Management Important Message (Signed)
Important Message  Patient Details  Name: Stephanie KiefJean E Welch MRN: [id number] Date of Birth: [birth year]   Medicare Important Message Given:  Yes    Lenzy Kerschner A, RN 01/24/2016, 6:48 AM

## 2016-01-24 NOTE — Anesthesia Preprocedure Evaluation (Addendum)
Anesthesia Evaluation  Patient identified by MRN, date of birth, ID band Patient confused    Reviewed: Allergy & Precautions, NPO status , Patient's Chart, lab work & pertinent test results, Unable to perform ROS - Chart review only  Airway Mallampati: II       Dental   Pulmonary neg pulmonary ROS,           Cardiovascular hypertension, Pt. on medications and Pt. on home beta blockers + CAD       Neuro/Psych negative neurological ROS     GI/Hepatic   Endo/Other  diabetes, Type 2, Oral Hypoglycemic AgentsHypothyroidism   Renal/GU      Musculoskeletal   Abdominal   Peds  Hematology   Anesthesia Other Findings   Reproductive/Obstetrics                            Anesthesia Physical Anesthesia Plan  ASA: III and emergent  Anesthesia Plan: General   Post-op Pain Management:    Induction: Intravenous  Airway Management Planned:   Additional Equipment:   Intra-op Plan:   Post-operative Plan:   Informed Consent: I have reviewed the patients History and Physical, chart, labs and discussed the procedure including the risks, benefits and alternatives for the proposed anesthesia with the patient or authorized representative who has indicated his/her understanding and acceptance.     Plan Discussed with:   Anesthesia Plan Comments:         Anesthesia Quick Evaluation

## 2016-01-24 NOTE — Progress Notes (Signed)
Subjective: Admitted with hip fx.  Objective: Vital signs in last 24 hours: Temp:  [97.5 F (36.4 C)-100 F (37.8 C)] 98.6 F (37 C) (05/29 0724) Pulse Rate:  [78-89] 89 (05/29 0724) Resp:  [16-19] 18 (05/29 0724) BP: (109-134)/(64-80) 123/76 mmHg (05/29 0724) SpO2:  [89 %-96 %] 92 % (05/29 0724) Weight change:     Intake/Output from previous day: 05/28 0701 - 05/29 0700 In: -  Out: 1175 [Urine:1175] Intake/Output this shift:    General appearance: alert and no distress Head: Normocephalic, without obvious abnormality, atraumatic Resp: clear to auscultation bilaterally and normal percussion bilaterally Cardio: irregularly irregular rhythm and no rub GI: soft, non-tender; bowel sounds normal; no masses,  no organomegaly Extremities: extremities normal, atraumatic, no cyanosis or edema Neurologic: Grossly normal  Lab Results:  Recent Labs  01/23/16 0442 01/24/16 0404  WBC 8.6 8.5  HGB 12.8 12.6  HCT 39.5 38.6  PLT 174 151   BMET  Recent Labs  01/23/16 0442 01/24/16 0404  NA 137 136  K 4.5 3.8  CL 104 104  CO2 25 25  GLUCOSE 160* 145*  BUN 21* 21*  CREATININE 0.92 0.89  CALCIUM 8.7* 8.6*    Studies/Results: Dg Chest 1 View  01/22/2016  CLINICAL DATA:  Preop, right hip fracture. EXAM: CHEST 1 VIEW COMPARISON:  PA and lateral views 01/16/2012 FINDINGS: Go cardiomegaly has increased from exam 4 years prior. Interstitial prominence suggesting pulmonary edema has also increased. The lungs are mildly hyperinflated. There is blunting of both costophrenic angles. Linear opacity in the left midlung zone is likely atelectasis or scarring. No confluent airspace disease or pneumothorax. No displaced rib fracture. IMPRESSION: 1. Cardiomegaly, progressed from most recent comparison exam 4 years prior. Increased interstitial markings, likely pulmonary edema. Findings suggestive of low-grade CHF 2. Blunting of the costophrenic angles may be small effusions or secondary to  hyperinflation. Electronically Signed   By: Rubye OaksMelanie  Ehinger M.D.   On: 01/22/2016 21:08   Dg Hip Unilat With Pelvis 2-3 Views Right  01/22/2016  CLINICAL DATA:  Right hip pain after fall from standing.  Injury. EXAM: DG HIP (WITH OR WITHOUT PELVIS) 2-3V RIGHT COMPARISON:  None. FINDINGS: Displaced right femoral neck fracture with proximal migration of the femoral shaft. No significant angulation. Femoral head remains seated. No additional fracture of the pelvis or left hip. The bones are under mineralized. IMPRESSION: Displaced right femoral neck fracture. Electronically Signed   By: Rubye OaksMelanie  Ehinger M.D.   On: 01/22/2016 21:06    Medications: Continuous:  EAV:[id number]:acetaminophen **OR** acetaminophen, bisacodyl, morphine injection  Assessment/Plan: 1. Hip Fracture: Plan : Patient is medically optimized for surgery. INR down to safe range. Will follow post op. 2. Pulmonary Edema: Found on cxr. Patient is asymptomatic.Marland Kitchen.Echo shows mild reduced EF consistant with systolic CHF. She has been diuresed and is on B-blocker and ACE for appropriate treatment.  3. Atrial Fib: Rate controlled. Holding coumadin for procedure. Resume post op. 4. Hypothyriodism: On synthroid.  Time spent= 20 min    LOS: 1 day   Gracelyn NurseJohnston,  John D 01/24/2016, 8:42 AM

## 2016-01-24 NOTE — Clinical Social Work Placement (Signed)
   CLINICAL SOCIAL WORK PLACEMENT  NOTE  Date:  01/24/2016  Patient Details  Name: Stephanie Welch MRN: [id number] Date of Birth: 1927/07/19  Clinical Social Work is seeking post-discharge placement for this patient at the Skilled  Nursing Facility level of care (*CSW will initial, date and re-position this form in  chart as items are completed):  Yes   Patient/family provided with [place] Clinical Social Work Department's list of facilities offering this level of care within the geographic area requested by the patient (or if unable, by the patient's family).  Yes   Patient/family informed of their freedom to choose among providers that offer the needed level of care, that participate in Medicare, Medicaid or managed care program needed by the patient, have an available bed and are willing to accept the patient.  Yes   Patient/family informed of [facility]'s ownership interest in [facility] and [facility], as well as of the fact that they are under no obligation to receive care at these facilities.  PASRR submitted to EDS on       PASRR number received on       Existing PASRR number confirmed on 01/24/16     FL2 transmitted to all facilities in geographic area requested by pt/family on 01/24/16     FL2 transmitted to all facilities within larger geographic area on       Patient informed that his/her managed care company has contracts with or will negotiate with certain facilities, including the following:        Yes   Patient/family informed of bed offers received.  Patient chooses bed at  [facility]     Physician recommends and patient chooses bed at      Patient to be transferred to   on  .  Patient to be transferred to facility by       Patient family notified on   of transfer.  Name of family member notified:        PHYSICIAN       Additional Comment:    _______________________________________________ Haig ProphetMorgan, Jazzman Loughmiller G, LCSW 01/24/2016, 2:04  PM

## 2016-01-24 NOTE — Clinical Social Work Note (Signed)
Clinical Social Work Assessment  Patient Details  Name: Stephanie Welch MRN: [id number] Date of Birth: 06/24/1927  Date of referral:  01/24/16               Reason for consult:  Facility Placement, Other (Comment Required) (From [facility] )                Permission sought to share information with:  Facility Contact Representative Permission granted to share information::  Yes, Verbal Permission Granted  Name::       [facility]   Agency::     Relationship::     Contact Information:     Housing/Transportation Living arrangements for the past 2 months:  Independent Living Facility Source of Information:  Patient Patient Interpreter Needed:  None Criminal Activity/Legal Involvement Pertinent to Current Situation/Hospitalization:  No - Comment as needed Significant Relationships:  Friend Lives with:  Self, Facility Resident Do you feel safe going back to the place where you live?  Yes Need for family participation in patient care:  Yes (Comment)  Care giving concerns:  Patient lives at [facility] independent [facility].    Social Worker assessment / plan:  Clinical Social Worker (CSW) received consult that patient is from [facility]. Per Amy admissions coordinator at [facility] patient is from independent living and can come to [facility] for STR when she is medically stable. CSW met with patient alone at bedside after surgery. Patient reported that she lives at [facility] and is agreeable to going to [facility] for rehab. Per patient she has been to [facility] in the past for rehab. Patient was alert and oriented and was sitting up in the bed.   FL2 complete and faxed out. Plan is for patient to D/C to [facility].   Employment status:  Disabled (Comment on whether or not currently receiving Disability), Retired Insurance information:  Medicare PT Recommendations:  Not assessed at this  time Information / Referral to community resources:  Skilled Nursing Facility  Patient/Family's Response to care:  Patient is agreeable to going to [facility].   Patient/Family's Understanding of and Emotional Response to Diagnosis, Current Treatment, and Prognosis:  Patient was pleasant and thanked CSW for visit.   Emotional Assessment Appearance:  Appears stated age Attitude/Demeanor/Rapport:    Affect (typically observed):  Accepting, Adaptable, Pleasant Orientation:  Oriented to Self, Oriented to Place, Oriented to  Time, Oriented to Situation Alcohol / Substance use:  Not Applicable Psych involvement (Current and /or in the community):  No (Comment)  Discharge Needs  Concerns to be addressed:  Discharge Planning Concerns Readmission within the last 30 days:  No Current discharge risk:  Dependent with Mobility Barriers to Discharge:  Continued Medical Work up   Morgan,  G, LCSW 01/24/2016, 2:06 PM  

## 2016-01-24 NOTE — Anesthesia Procedure Notes (Signed)
Procedure Name: Intubation Date/Time: 01/24/2016 9:24 AM Performed by: Irving BurtonBACHICH, Moriya Mitchell Pre-anesthesia Checklist: Patient identified, Emergency Drugs available, Suction available and Patient being monitored Patient Re-evaluated:Patient Re-evaluated prior to inductionOxygen Delivery Method: Circle system utilized Preoxygenation: Pre-oxygenation with 100% oxygen Intubation Type: IV induction Ventilation: Oral airway inserted - appropriate to patient size and Mask ventilation without difficulty Laryngoscope Size: Mac and 3 Grade View: Grade I Tube type: Oral Tube size: 7.0 mm Number of attempts: 1 Airway Equipment and Method: Stylet Placement Confirmation: ETT inserted through vocal cords under direct vision,  positive ETCO2 and breath sounds checked- equal and bilateral Secured at: 21 cm Tube secured with: Tape Dental Injury: Teeth and Oropharynx as per pre-operative assessment

## 2016-01-24 NOTE — Anesthesia Postprocedure Evaluation (Signed)
Anesthesia Post Note  Patient: Jerald KiefJean E Millikan  Procedure(s) Performed: Procedure(s) (LRB): ARTHROPLASTY BIPOLAR HIP (HEMIARTHROPLASTY) (Right)  Patient location during evaluation: PACU Anesthesia Type: General Level of consciousness: awake and alert Pain management: pain level controlled Vital Signs Assessment: post-procedure vital signs reviewed and stable Respiratory status: spontaneous breathing and respiratory function stable Cardiovascular status: stable Anesthetic complications: no    Last Vitals:  Filed Vitals:   01/24/16 1257 01/24/16 1400  BP: 139/80 129/75  Pulse: 85 83  Temp: 36.4 C 36.4 C  Resp: 17 18    Last Pain:  Filed Vitals:   01/24/16 1400  PainSc: 0-No pain                 KEPHART,WILLIAM K

## 2016-01-24 NOTE — Care Management Note (Addendum)
Case Management Note  Patient Details  Name: Stephanie Welch MRN: [id number] Date of Birth: [birth year]  Subjective/Objective:       82yo Stephanie Welch received a rt hip repair by Dr Joice LoftsPoggi today. [facility]. She is apparently blind and somewhat hard of hearing per sign on her door. She has been sleeping soundly both times this afternoon that CM has attempted to provide an assessment. CSW is following, [facility]              Action/Plan:   Expected Discharge Date:                  Expected Discharge Plan:     In-House Referral:     Discharge planning Services     Post Acute Care Choice:    Choice offered to:     DME Arranged:    DME Agency:     HH Arranged:    HH Agency:     Status of Service:     Medicare Important Message Given:  Yes Date Medicare IM Given:    Medicare IM give by:    Date Additional Medicare IM Given:    Additional Medicare Important Message give by:     If discussed at Long Length of Stay Meetings, dates discussed:    Additional Comments:  Dossie Swor A, RN 01/24/2016, 3:48 PM

## 2016-01-24 NOTE — [place] FL2 (Signed)
[place] MEDICAID FL2 LEVEL OF CARE SCREENING TOOL     IDENTIFICATION  Patient Name: Stephanie Welch Birthdate: 1927/07/16 Sex: female Admission Date (Current Location): 01/22/2016  [place]   Facility and Address:  [facility], [address], [place], [place] [address]      Provider Number: [id number]  Attending Physician Name and Address:  Gracelyn NurseJohn D Johnston, MD  Relative Name and Phone Number:       Current Level of Care: Hospital Recommended Level of Care: Skilled Nursing Facility Prior Approval Number:    Date Approved/Denied:   PASRR Number:  ( [id number] )  Discharge Plan: SNF    Current Diagnoses: Patient Active Problem List   Diagnosis Date Noted  . Femur fracture (HCC) 01/23/2016  . Hypothyroidism 01/23/2016  . HTN (hypertension) 01/23/2016  . Macular degeneration 01/23/2016    Orientation RESPIRATION BLADDER Height & Weight     Self, Time, Situation, Place  Normal Continent Weight: 181 lb (82.101 kg) Height:  5\' 6"  (167.6 cm)  BEHAVIORAL SYMPTOMS/MOOD NEUROLOGICAL BOWEL NUTRITION STATUS   (none )  (none ) Continent Diet (NPO for surgery )  AMBULATORY STATUS COMMUNICATION OF NEEDS Skin   Extensive Assist Verbally Surgical wounds                       Personal Care Assistance Level of Assistance  Bathing, Feeding, Dressing Bathing Assistance: Limited assistance Feeding assistance: Independent Dressing Assistance: Limited assistance     Functional Limitations Info  Sight, Hearing, Speech Sight Info: Adequate Hearing Info: Adequate Speech Info: Adequate    SPECIAL CARE FACTORS FREQUENCY  PT (By licensed PT), OT (By licensed OT)     PT Frequency:  (5) OT Frequency:  (5)            Contractures      Additional Factors Info  Code Status, Allergies, Insulin Sliding Scale Code Status Info:  (DNR ) Allergies Info:  (Conjugated Estrogens )   Insulin Sliding Scale Info:  (NovoLog  Insulin Injections )       Current Medications (01/24/2016):  This is the current hospital active medication list Current Facility-Administered Medications  Medication Dose Route Frequency Provider Last Rate Last Dose  . [MAR Hold] acetaminophen (TYLENOL) tablet 650 mg  650 mg Oral Q6H PRN Gery Prayebby Crosley, MD       Or  . Mitzi Hansen[MAR Hold] acetaminophen (TYLENOL) suppository 650 mg  650 mg Rectal Q6H PRN Gery Prayebby Crosley, MD      . Mitzi Hansen[MAR Hold] bisacodyl (DULCOLAX) EC tablet 5 mg  5 mg Oral Daily PRN Gery Prayebby Crosley, MD      . Mitzi Hansen[MAR Hold] Chlorhexidine Gluconate Cloth 2 % PADS 6 each  6 each Topical Q0600 Debby Crosley, MD   6 each at 01/24/16 0600  . [MAR Hold] citalopram (CELEXA) tablet 10 mg  10 mg Oral Daily Debby Crosley, MD   10 mg at 01/24/16 0840  . [MAR Hold] insulin aspart (novoLOG) injection 0-15 Units  0-15 Units Subcutaneous TID WC Gery Prayebby Crosley, MD   2 Units at 01/24/16 0825  . [MAR Hold] insulin aspart (novoLOG) injection 0-5 Units  0-5 Units Subcutaneous QHS Debby Crosley, MD   0 Units at 01/23/16 2200  . [MAR Hold] levothyroxine (SYNTHROID, LEVOTHROID) tablet 100 mcg  100 mcg Oral QAC breakfast Gery Prayebby Crosley, MD   100 mcg at 01/24/16 0824  . [MAR Hold] metoprolol succinate (TOPROL-XL) 24 hr tablet 150 mg  150  mg Oral Q breakfast Gery Pray, MD   150 mg at 01/24/16 0824  . [MAR Hold] morphine 4 MG/ML injection 4 mg  4 mg Intravenous Q30 min PRN Jene Every, MD   4 mg at 01/22/16 2105  . [MAR Hold] ramipril (ALTACE) capsule 5 mg  5 mg Oral Daily Debby Crosley, MD   5 mg at 01/24/16 1610     Discharge Medications: Please see discharge summary for a list of discharge medications.  Relevant Imaging Results:  Relevant Lab Results:   Additional Information  (SSN: [id number])  Haig Prophet, LCSW

## 2016-01-24 NOTE — Progress Notes (Addendum)
Per chart and patient's address it appears that [facility] [place] [facility] [place] [facility]. Clinical Social Worker (CSW) attempted to meet with patient however she was off the floor. CSW called patient's friend Bosie ClosJudith listed on the facesheet, however she did not answer and a voicemail. CSW also left a voicemail for the [facility] admissions coordinator. CSW will continue to follow and assist as needed.   CSW received a call back from Amy admissions coordinator at [facility] confirming that patient is an independent living resident at [facility] [place] [facility] [place] and can [facility] for [facility].   Jetta LoutBailey Morgan, LCSW [phone]

## 2016-01-24 NOTE — Op Note (Signed)
01/22/2016 - 01/24/2016  11:04 AM  Patient:   Stephanie KiefJean E Betley  Pre-Op Diagnosis:   Displaced femoral neck fracture, right hip.  Post-Op Diagnosis:   Same.  Procedure:   Right hip unipolar hemiarthroplasty.  Surgeon:   Maryagnes AmosJ. Jeffrey Poggi, MD  Assistant:   Van ClinesJon Wolfe, PA-C  Anesthesia:   GET  Findings:   As above.  Complications:   None  EBL:   50 cc  Fluids:   800 cc crystalloid  UOP:   200 cc  TT:   None  Drains:   None  Closure:   Staples  Implants:   Biomet press-fit system with a #13 standard offset Echo femoral stem, a 49 mm outer diameter shell, a 28 mm head, and a +3 mm neck  Brief Clinical Note:   The patient is an 82 year old female who sustained the above-noted injury 2 days ago when she tripped and fell in the kitchen of her assisted-living facility, landing on her right hip. She was brought to the emergency room where x-rays demonstrated the above-noted injury. The patient has been cleared medically and presents at this time for definitive management of the injury.  Procedure:   The patient was brought into the operating room. After adequate general endotracheal intubation and anesthesia was obtained, the patient was repositioned in the left lateral decubitus position and secured using a lateral hip positioner. The right hip and lower extremity were prepped with ChloroPrep solution before being draped sterilely. Preoperative antibiotics were administered. A timeout was performed to verify the appropriate surgical site before a standard posterior approach to the hip was made through an approximately 5-6 inch incision. The incision was carried down through the subcutaneous tissues to expose the gluteal fascia and proximal end of the iliotibial band. These structures were split the length of the incision and the Charnley self-retaining hip retractor placed. The bursal tissues were swept posteriorly to expose the short external rotators. The anterior border of the piriformis  tendon was identified and this plane developed down through the capsule to enter the joint. Abundant fracture hematoma was suctioned. A flap of tissue was elevated off the posterior aspect of the femoral neck and greater trochanter and retracted posteriorly. This flap included the piriformis tendon, the short external rotators, and the posterior capsule. The femoral head was removed in its entirety, then taken to the back table where it was measured and found to be optimally replicated by a 49 mm head. The appropriate trial head was inserted and found to demonstrate an excellent suction fit.   Attention was directed to the femoral side. The femoral neck was recut 10-12 mm above the lesser trochanter using an oscillating saw. The piriformis fossa was debrided of soft tissues before the intramedullary canal was accessed through this point using a triple step reamer. The canal was reamed sequentially beginning with a #7 tapered reamer and progressing to a #13 tapered reamer. This provided excellent circumferential chatter. A box osteotome was used to establish version before the canal was broached sequentially beginning with a #10 broach and progressing to a #13 broach. This was left in place and several trial reductions performed. The permanent #13 standard offset femoral stem was impacted into place. A repeat trial reduction was performed using both the +0 mm and +3 mm neck lengths. The +3 mm neck length demonstrated excellent stability both in extension and external rotation as well as with flexion to 90 and internal rotation beyond 70. It also was stable in the position  of sleep. The 49 mm outer diameter shell with the +3 mm neck adapter construct was put together on the back table before being impacted onto the stem of the femoral component. The Morse taper locking mechanism was verified using manual distraction before the head was relocated and placed through a range of motion with the findings as described  above.  The wound was copiously irrigated with bacitracin saline solution via the jet lavage system before the peri-incisional and pericapsular tissues were injected with 30 cc of 0.5% Sensorcaine with epinephrine and 20 cc of Exparel diluted out to 60 cc with normal saline to help with postoperative analgesia. The posterior flap was reapproximated to the posterior aspect of the greater trochanter using #2 FiberWire interrupted sutures placed through drill holes. The iliotibial band was reapproximated using #1 Vicryl interrupted sutures before the gluteal fascia was closed using a running #1 Vicryl suture. At this point, 1 g of transexemic acid in 10 cc of normal saline was injected into the joint to help reduce postoperative bleeding. The subcutaneous tissues were closed in several layers using 2-0 Vicryl interrupted sutures before the skin was closed using staples. A sterile occlusive dressing was applied to the wound before the patient was placed into an abduction wedge pillow. The patient was then rolled back into the supine position on the hospital bed before being awakened, extubated, and returned to the recovery room in satisfactory condition after tolerating the procedure well.

## 2016-01-25 ENCOUNTER — Encounter: Payer: Self-pay | Admitting: Surgery

## 2016-01-25 LAB — BASIC METABOLIC PANEL
ANION GAP: 6 (ref 5–15)
BUN: 23 mg/dL — ABNORMAL HIGH (ref 6–20)
CALCIUM: 8.1 mg/dL — AB (ref 8.9–10.3)
CO2: 23 mmol/L (ref 22–32)
Chloride: 105 mmol/L (ref 101–111)
Creatinine, Ser: 1.01 mg/dL — ABNORMAL HIGH (ref 0.44–1.00)
GFR, EST AFRICAN AMERICAN: 56 mL/min — AB (ref >60)
GFR, EST NON AFRICAN AMERICAN: 49 mL/min — AB (ref >60)
Glucose, Bld: 163 mg/dL — ABNORMAL HIGH (ref 65–99)
POTASSIUM: 4 mmol/L (ref 3.5–5.1)
SODIUM: 134 mmol/L — AB (ref 135–145)

## 2016-01-25 LAB — GLUCOSE, CAPILLARY
GLUCOSE-CAPILLARY: 175 mg/dL — AB (ref 65–99)
GLUCOSE-CAPILLARY: 189 mg/dL — AB (ref 65–99)
Glucose-Capillary: 124 mg/dL — ABNORMAL HIGH (ref 65–99)
Glucose-Capillary: 115 mg/dL — ABNORMAL HIGH (ref 65–99)

## 2016-01-25 LAB — CBC WITH DIFFERENTIAL/PLATELET
BASOS ABS: 0.1 10*3/uL (ref 0–0.1)
EOS ABS: 0.3 10*3/uL (ref 0–0.7)
HCT: 36.5 % (ref 35.0–47.0)
Hemoglobin: 12 g/dL (ref 12.0–16.0)
Lymphs Abs: 1 10*3/uL (ref 1.0–3.6)
MCH: 31.8 pg (ref 26.0–34.0)
MCHC: 32.8 g/dL (ref 32.0–36.0)
MCV: 96.9 fL (ref 80.0–100.0)
Monocytes Absolute: 1.2 10*3/uL — ABNORMAL HIGH (ref 0.2–0.9)
Monocytes Relative: 13 %
Neutro Abs: 6.6 10*3/uL — ABNORMAL HIGH (ref 1.4–6.5)
Neutrophils Relative %: 72 %
PLATELETS: 141 10*3/uL — AB (ref 150–440)
RBC: 3.77 MIL/uL — AB (ref 3.80–5.20)
RDW: 14.1 % (ref 11.5–14.5)
WBC: 9.2 10*3/uL (ref 3.6–11.0)

## 2016-01-25 MED ORDER — WARFARIN - PHYSICIAN DOSING INPATIENT: Freq: Every day | Status: DC

## 2016-01-25 MED ORDER — WARFARIN - PHARMACIST DOSING INPATIENT: Freq: Every day | Status: DC

## 2016-01-25 MED ORDER — WARFARIN SODIUM 2 MG PO TABS
6.0000 mg | ORAL_TABLET | Freq: Once | ORAL | Status: AC
  Administered 2016-01-25: 6 mg via ORAL
  Filled 2016-01-25: qty 3

## 2016-01-25 MED ORDER — WARFARIN SODIUM 2 MG PO TABS: 4.0000 mg | ORAL_TABLET | Freq: Every day | ORAL | Status: DC

## 2016-01-25 MED ORDER — HYDROMORPHONE HCL 1 MG/ML IJ SOLN: 0.5000 mg | Freq: Four times a day (QID) | INTRAMUSCULAR | Status: DC | PRN

## 2016-01-25 MED ORDER — BISACODYL 5 MG PO TBEC
10.0000 mg | DELAYED_RELEASE_TABLET | Freq: Every day | ORAL | Status: AC
  Administered 2016-01-25 – 2016-01-26 (×2): 10 mg via ORAL
  Filled 2016-01-25 (×2): qty 2

## 2016-01-25 NOTE — Progress Notes (Signed)
Pt confused, very difficult to redirect. POD 1. Pulled off tele monitor and pulled out iv this shift. Needs constant verbal support and reassurance. No issues with surgical site noted.

## 2016-01-25 NOTE — Progress Notes (Signed)
   Subjective: 1 Day Post-Op Procedure(s) (LRB): ARTHROPLASTY BIPOLAR HIP (HEMIARTHROPLASTY) (Right) Patient reports pain as mild.  Complaining of right knee pain and not so much the hip Patient is well, and has had no acute complaints or problems We will start therapy today.  Plan is to go Rehab after hospital stay. no nausea and no vomiting Patient denies any chest pains or shortness of breath.  Objective: Vital signs in last 24 hours: Temp:  [97 F (36.1 C)-99.1 F (37.3 C)] 98.4 F (36.9 C) (05/30 0621) Pulse Rate:  [62-103] 103 (05/30 0621) Resp:  [13-20] 18 (05/30 0621) BP: (91-139)/(58-97) 118/64 mmHg (05/30 0621) SpO2:  [91 %-100 %] 96 % (05/30 0621) well approximated incision Heels are non tender and elevated off the bed using rolled towels Intake/Output from previous day: 05/29 0701 - 05/30 0700 In: 1000 [I.V.:1000] Out: 900 [Urine:850; Blood:50] Intake/Output this shift:     Recent Labs  01/22/16 2025 01/23/16 0442 01/24/16 0404 01/25/16 0318  HGB 13.2 12.8 12.6 12.0    Recent Labs  01/24/16 0404 01/25/16 0318  WBC 8.5 9.2  RBC 3.95 3.77*  HCT 38.6 36.5  PLT 151 141*    Recent Labs  01/24/16 0404 01/25/16 0318  NA 136 134*  K 3.8 4.0  CL 104 105  CO2 25 23  BUN 21* 23*  CREATININE 0.89 1.01*  GLUCOSE 145* 163*  CALCIUM 8.6* 8.1*    Recent Labs  01/23/16 1753 01/24/16 0404  INR 1.82 1.46    EXAM General - Patient is Alert, Appropriate and Oriented. Not only knew where she was but also what floor she was on. Extremity - Neurologically intact Neurovascular intact Sensation intact distally Intact pulses distally Dorsiflexion/Plantar flexion intact Compartment soft Dressing - dressing C/D/I Motor Function - intact, moving foot and toes well on exam.    Past Medical History  Diagnosis Date  . Thyroid disease   . Hypertension   . Diabetes mellitus without complication (HCC)   . Macular degeneration   . Coronary artery disease      Assessment/Plan: 1 Day Post-Op Procedure(s) (LRB): ARTHROPLASTY BIPOLAR HIP (HEMIARTHROPLASTY) (Right) Active Problems:   Femur fracture (HCC)   Hypothyroidism   HTN (hypertension)   Macular degeneration  Estimated body mass index is 29.23 kg/(m^2) as calculated from the following:   Height as of this encounter: 5\' 6"  (1.676 m).   Weight as of this encounter: 82.101 kg (181 lb). Advance diet Up with therapy D/C IV fluids Discharge to SNF  Labs: were reviewed DVT Prophylaxis - Lovenox, TED hose and compression leg wraps bilaterally  Weight-Bearing as tolerated to right leg D/C O2 and Pulse OX and try on Room Air Begin working on a bowel movement Should be able to be d/c when medically cleared F/u with Dr. Joice LoftsPoggi in 6 weeks Staples are to be removed at 2 weeks post op. Continue lovenox 30mg  q12hrs for 2 weeks after discharged  Jon R. [facility] PA [facility] 01/25/2016, 7:20 AM

## 2016-01-25 NOTE — Evaluation (Signed)
Physical Therapy Evaluation Patient Details Name: Stephanie Welch MRN: [id number] DOB: 1927-01-15 Today's Date: 01/25/2016   History of Present Illness  Patient is an 82 y/o female that presents after sustaining a fall at her independent living facility. She sustained R hip fx with replacement.  Pt with macular degeneration.  Clinical Impression  Pt with some confusion and difficulty fully participating, but she is pleasant and does make good effort with what she is able to do.  Pt needed a lot of assist with all mobility and was not safe with ambulation.  She was unable to move either foot without at least some assist unweighting LEs.  Pt showed good effort with ~10 minutes of LE exercises apart from exam, but was pain limited and weak needing AAROM for most acts.     Follow Up Recommendations SNF    Equipment Recommendations  Rolling walker with 5" wheels (pt has rollator, unsure if she also has FWW)    Recommendations for Other Services       Precautions / Restrictions Precautions Precautions: Fall Restrictions Weight Bearing Restrictions: Yes RLE Weight Bearing: Weight bearing as tolerated      Mobility  Bed Mobility Overal bed mobility: Needs Assistance Bed Mobility: Supine to Sit     Supine to sit: Max assist     General bed mobility comments: Pt able to show some effort, but essentially very weak and limited  Transfers Overall transfer level: Needs assistance Equipment used: Rolling walker (2 wheeled) Transfers: Sit to/from Stand Sit to Stand: Max assist;Mod assist         General transfer comment: Pt shows good effort in getting to standing but needs a lot of assist to get to upright  Ambulation/Gait Ambulation/Gait assistance: Max assist Ambulation Distance (Feet): 2 Feet Assistive device: Rolling walker (2 wheeled)       General Gait Details: Pt able to do some WBing on R and move each foot minimally with assist but is unsteady and needs heavy assist  to insure she remained standing and safe  Stairs            Wheelchair Mobility    Modified Rankin (Stroke Patients Only)       Balance Overall balance assessment: Needs assistance   Sitting balance-Leahy Scale: Fair       Standing balance-Leahy Scale: Poor                               Pertinent Vitals/Pain Pain Assessment: 0-10 Pain Score: 6     Home Living Family/patient expects to be discharged to:: Skilled nursing facility                      Prior Function Level of Independence: Independent with assistive device(s)         Comments: difficult to gather how much she was actually doing for herself     Hand Dominance        Extremity/Trunk Assessment   Upper Extremity Assessment: Generalized weakness (age appropriate deficits)           Lower Extremity Assessment: Generalized weakness RLE Deficits / Details: L LE grossly 3+/5; R LE 3-/5, pain with most acts       Communication   Communication: No difficulties  Cognition Arousal/Alertness: Awake/alert Behavior During Therapy: WFL for tasks assessed/performed Overall Cognitive Status:  (pt did have some apparent confusion/decreased awareness)  General Comments      Exercises Total Joint Exercises Ankle Circles/Pumps: AROM;10 reps Quad Sets: Strengthening;10 reps Gluteal Sets: Strengthening;10 reps Heel Slides: AAROM;AROM;10 reps Hip ABduction/ADduction: AAROM;10 reps      Assessment/Plan    PT Assessment Patient needs continued PT services  PT Diagnosis Difficulty walking;Generalized weakness   PT Problem List Decreased strength;Decreased range of motion;Decreased balance;Decreased activity tolerance;Decreased cognition;Decreased coordination;Decreased mobility;Decreased safety awareness;Decreased knowledge of use of DME;Decreased knowledge of precautions;Pain  PT Treatment Interventions DME instruction;Functional mobility  training;Gait training;Therapeutic activities;Therapeutic exercise;Balance training;Neuromuscular re-education;Patient/family education   PT Goals (Current goals can be found in the Care Plan section) Acute Rehab PT Goals Patient Stated Goal: Get back to walking PT Goal Formulation: With patient Time For Goal Achievement: 02/08/16 Potential to Achieve Goals: Fair    Frequency BID   Barriers to discharge        Co-evaluation               End of Session Equipment Utilized During Treatment: Gait belt Activity Tolerance: Patient tolerated treatment well Patient left: with call bell/phone within reach;with chair alarm set Nurse Communication: Mobility status         Time: [id number] PT Time Calculation (min) (ACUTE ONLY): 29 min   Charges:   PT Evaluation $PT Eval Moderate Complexity: 1 Procedure PT Treatments $Therapeutic Exercise: 8-22 mins   PT G Codes:        Malachi ProGalen R Londynn Sonoda, PT  01/25/2016, 1:32 PM

## 2016-01-25 NOTE — Progress Notes (Signed)
Physical Therapy Treatment Patient Details Name: Stephanie KiefJean E Welch MRN: [id number] DOB: May 27, 1927 Today's Date: 01/25/2016    History of Present Illness Patient is an 82 y/o female that presents after sustaining a fall at her independent living facility. She sustained R hip fx with replacement.  Pt with macular degeneration.    PT Comments    Pt is pleasant t/o session and genuinely appears eager to work with PT but she is very weak and has a lot of pain with even minimal R LE movement.  Pt essentially dependent with any weight shift/stepping and needs almost total assist to get back into the bed from the recliner.  Pt shows increased strength with LE acts in bed and was able to do SAQ AROM and showed increased strength with AAROM hip ABD on R.  Overall pt is very weak and does need a lot of assist with mobility.   Follow Up Recommendations  SNF     Equipment Recommendations  Rolling walker with 5" wheels    Recommendations for Other Services       Precautions / Restrictions Precautions Precautions: Fall Restrictions Weight Bearing Restrictions: Yes RLE Weight Bearing: Weight bearing as tolerated    Mobility  Bed Mobility Overal bed mobility: Needs Assistance Bed Mobility: Sit to Supine     Supine to sit: Max assist Sit to supine: Max assist   General bed mobility comments: Pt willing to try/assist, but is very weak and fatigued... needs heavy assist  Transfers Overall transfer level: Needs assistance Equipment used: Rolling walker (2 wheeled) Transfers: Sit to/from Stand Sit to Stand: Max assist;Mod assist         General transfer comment: Again pt shows good effort getting to standing, but is weak and pain limited and needs considerable assist.   Ambulation/Gait Ambulation/Gait assistance: Max assist Ambulation Distance (Feet): 2 Feet Assistive device: Rolling walker (2 wheeled)       General Gait Details: Pt again struggles to really effectively move either  LE despite extreme assist from PT to [facility] and phyiscally assist moving them.  Pt shows effort but is simply too weak to really effectively take any steps.    Stairs            Wheelchair Mobility    Modified Rankin (Stroke Patients Only)       Balance Overall balance assessment: Needs assistance   Sitting balance-Leahy Scale: Fair       Standing balance-Leahy Scale: Poor                      Cognition Arousal/Alertness: Lethargic Behavior During Therapy: WFL for tasks assessed/performed Overall Cognitive Status: Within Functional Limits for tasks assessed                      Exercises Total Joint Exercises Ankle Circles/Pumps: Strengthening;10 reps Quad Sets: Strengthening;10 reps Gluteal Sets: Strengthening;10 reps Short Arc Quad: 10 reps;AROM Heel Slides: AAROM;AROM;10 reps Hip ABduction/ADduction: AAROM;10 reps    General Comments        Pertinent Vitals/Pain Pain Assessment: 0-10 Pain Score:  (not rated, minimal pain at rest, very increased with mvt)    Home Living Family/patient expects to be discharged to:: Skilled nursing facility                    Prior Function Level of Independence: Independent with assistive device(s)      Comments: difficult to gather how much she was actually doing  for herself   PT Goals (current goals can now be found in the care plan section) Acute Rehab PT Goals Patient Stated Goal: Get back to walking PT Goal Formulation: With patient Time For Goal Achievement: 02/08/16 Potential to Achieve Goals: Fair Progress towards PT goals: Progressing toward goals    Frequency  BID    PT Plan Current plan remains appropriate    Co-evaluation             End of Session Equipment Utilized During Treatment: Gait belt Activity Tolerance: Patient limited by pain;Patient limited by fatigue Patient left: with call bell/phone within reach;with bed alarm set     Time: 1450-1518 PT Time  Calculation (min) (ACUTE ONLY): 28 min  Charges:  $Gait Training: 8-22 mins $Therapeutic Exercise: 8-22 mins                    G Codes:      Malachi Pro, DPT  01/25/2016, 4:03 PM

## 2016-01-25 NOTE — Progress Notes (Signed)
Subjective:  Admitted with hip fx. Sitting in the chair. Does not complain of any pain. Afebrile.  Objective: Vital signs in last 24 hours: Temp:  [97.5 F (36.4 C)-99.1 F (37.3 C)] 98.2 F (36.8 C) (05/30 0734) Pulse Rate:  [77-103] 90 (05/30 0734) Resp:  [16-18] 18 (05/30 0621) BP: (91-127)/(58-82) 108/62 mmHg (05/30 0734) SpO2:  [93 %-100 %] 99 % (05/30 0734) Weight change:  Last BM Date: 01/23/16  Intake/Output from previous day: 05/29 0701 - 05/30 0700 In: 1000 [I.V.:1000] Out: 900 [Urine:850; Blood:50] Intake/Output this shift: Total I/O In: 240 [P.O.:240] Out: -   General appearance: alert and no distress Head: Normocephalic, without obvious abnormality, atraumatic Resp: clear to auscultation bilaterally and normal percussion bilaterally Cardio: irregularly irregular rhythm and no rub GI: soft, non-tender; bowel sounds normal; no masses,  no organomegaly Extremities: extremities normal, atraumatic, no cyanosis or edema Neurologic: Grossly normal  Lab Results:  Recent Labs  01/24/16 0404 01/25/16 0318  WBC 8.5 9.2  HGB 12.6 12.0  HCT 38.6 36.5  PLT 151 141*   BMET  Recent Labs  01/24/16 0404 01/25/16 0318  NA 136 134*  K 3.8 4.0  CL 104 105  CO2 25 23  GLUCOSE 145* 163*  BUN 21* 23*  CREATININE 0.89 1.01*  CALCIUM 8.6* 8.1*    Studies/Results: Dg Hip Port Unilat With Pelvis 1v Right  01/24/2016  CLINICAL DATA:  Status post right hip hemiarthroplasty EXAM: DG HIP (WITH OR WITHOUT PELVIS) 1V PORT RIGHT COMPARISON:  01/22/2016 right hip radiograph FINDINGS: Status post right hip hemiarthroplasty with well-positioned right proximal femoral prosthesis. No evidence of dislocation at the right hip joint. No new osseous fracture. No suspicious focal osseous lesion. Expected soft tissue gas within and surrounding the right hip. Skin staples lateral to the right hip. IMPRESSION: Satisfactory immediate postoperative appearance status post right hip  hemiarthroplasty. Electronically Signed   By: Delbert PhenixJason A Poff M.D.   On: 01/24/2016 11:51    Medications: Continuous:  ZOX:[id number]:acetaminophen **OR** acetaminophen, bisacodyl, bisacodyl, diphenhydrAMINE, HYDROmorphone (DILAUDID) injection, magnesium hydroxide, metoCLOPramide **OR** metoCLOPramide (REGLAN) injection, ondansetron **OR** ondansetron (ZOFRAN) IV, oxyCODONE, sodium phosphate  Assessment/Plan:  1. Right  Hip Fracture POD - 1 Pain medications PRN  2. Acute on chronic systolic chf Pulmonary Edema - Found on cxr. Patient is asymptomatic. Echo shows mild reduced EF consistant with systolic CHF. She has been diuresed and is on B-blocker and ACE for appropriate treatment.   3. Atrial Fib: Rate controlled. Held coumadin for procedure.  We'll resume Coumadin from tomorrow.  4. Hypothyriodism: On synthroid.  Possible discharge tomorrow  Time spent= 25 min     LOS: 2 days   Orie FishermanSudini, Jourden Gilson R 01/25/2016, 3:43 PM

## 2016-01-25 NOTE — Consult Note (Signed)
ANTICOAGULATION CONSULT NOTE - Initial Consult  Pharmacy Consult for warfarin Indication: atrial fibrillation  Allergies  Allergen Reactions  . Conjugated Estrogens     Other reaction(s): Unknown    Patient Measurements: Height: 5\' 6"  (167.6 cm) Weight: 181 lb (82.101 kg) IBW/kg (Calculated) : 59.3 Heparin Dosing Weight:   Vital Signs: Temp: 97.8 F (36.6 C) (05/30 1612) Temp Source: Oral (05/30 1612) BP: 111/74 mmHg (05/30 1612) Pulse Rate: 97 (05/30 1612)  Labs:  Recent Labs  01/23/16 0442 01/23/16 1753 01/24/16 0404 01/25/16 0318  HGB 12.8  --  12.6 12.0  HCT 39.5  --  38.6 36.5  PLT 174  --  151 141*  LABPROT 25.8* 21.0* 17.8*  --   INR 2.39 1.82 1.46  --   CREATININE 0.92  --  0.89 1.01*    Estimated Creatinine Clearance: 42.4 mL/min (by C-G formula based on Cr of 1.01).   Medical History: Past Medical History  Diagnosis Date  . Thyroid disease   . Hypertension   . Diabetes mellitus without complication (HCC)   . Macular degeneration   . Coronary artery disease     Medications:  Scheduled:  . bisacodyl  10 mg Oral Daily  . Chlorhexidine Gluconate Cloth  6 each Topical Q0600  . citalopram  10 mg Oral Daily  . docusate sodium  100 mg Oral BID  . enoxaparin (LOVENOX) injection  40 mg Subcutaneous Q24H  . insulin aspart  0-15 Units Subcutaneous TID WC  . insulin aspart  0-5 Units Subcutaneous QHS  . levothyroxine  100 mcg Oral QAC breakfast  . metoprolol succinate  150 mg Oral Q breakfast  . pantoprazole  40 mg Oral BID  . [START ON 01/26/2016] warfarin  4 mg Oral q1800  . Warfarin - Physician Dosing Inpatient   Does not apply q1800    Assessment: Pt is a 82 year old female with PMH of Afib. Pt is s/p arthroplasty of hip. Pt received 2mg  IV of vit K on 5/28. Warfarin has been on hold since. Pt is currently on lovenox 40 q 24 hours. Pt home dose is 4mg  on Mon, tue, wed, thus and 6mg  on Fri, sat and Sunday. INR yesterday AM was 1.46.  Goal of  Therapy:  INR 2-3 Monitor platelets by anticoagulation protocol: Yes   Plan:  Will give 6mg  tonight and then resume home dose tomorrow. Will continue lovenox until INR is therapeutic x2.  Melissa D Maccia 01/25/2016,6:37 PM

## 2016-01-26 ENCOUNTER — Encounter
Admission: RE | Admit: 2016-01-26 | Discharge: 2016-01-26 | Disposition: A | Discharge status: Home / Self Care | Payer: Medicare Other | Source: Ambulatory Visit | Attending: Internal Medicine | Admitting: Internal Medicine

## 2016-01-26 LAB — PROTIME-INR
INR: 1.76
PROTHROMBIN TIME: 20.5 s — AB (ref 11.4–15.0)

## 2016-01-26 LAB — BASIC METABOLIC PANEL
Anion gap: 7 (ref 5–15)
BUN: 28 mg/dL — ABNORMAL HIGH (ref 6–20)
CALCIUM: 8.2 mg/dL — AB (ref 8.9–10.3)
CO2: 23 mmol/L (ref 22–32)
CREATININE: 0.92 mg/dL (ref 0.44–1.00)
Chloride: 103 mmol/L (ref 101–111)
GFR calc non Af Amer: 54 mL/min — ABNORMAL LOW (ref >60)
Glucose, Bld: 108 mg/dL — ABNORMAL HIGH (ref 65–99)
Potassium: 4 mmol/L (ref 3.5–5.1)
SODIUM: 133 mmol/L — AB (ref 135–145)

## 2016-01-26 LAB — SURGICAL PATHOLOGY

## 2016-01-26 LAB — GLUCOSE, CAPILLARY
Glucose-Capillary: 101 mg/dL — ABNORMAL HIGH (ref 65–99)
Glucose-Capillary: 183 mg/dL — ABNORMAL HIGH (ref 65–99)
Glucose-Capillary: 145 mg/dL — ABNORMAL HIGH (ref 65–99)

## 2016-01-26 MED ORDER — OXYCODONE-ACETAMINOPHEN 5-325 MG PO TABS: 2.0000 | ORAL_TABLET | Freq: Four times a day (QID) | ORAL | Status: DC | PRN

## 2016-01-26 MED ORDER — DOCUSATE SODIUM 100 MG PO CAPS: 100.0000 mg | ORAL_CAPSULE | Freq: Two times a day (BID) | ORAL | Status: DC

## 2016-01-26 MED ORDER — WARFARIN SODIUM 2 MG PO TABS: 4.0000 mg | ORAL_TABLET | Freq: Once | ORAL | Status: DC

## 2016-01-26 MED ORDER — ENOXAPARIN SODIUM 40 MG/0.4ML ~~LOC~~ SOLN: 40.0000 mg | SUBCUTANEOUS | Status: DC

## 2016-01-26 NOTE — Progress Notes (Signed)
EMS here to transport pt. 

## 2016-01-26 NOTE — Clinical Social Work Placement (Signed)
   CLINICAL SOCIAL WORK PLACEMENT  NOTE  Date:  01/26/2016  Patient Details  Name: Stephanie Welch MRN: [id number] Date of Birth: 1927-02-07  Clinical Social Work is seeking post-discharge placement for this patient at the Skilled  Nursing Facility level of care (*CSW will initial, date and re-position this form in  chart as items are completed):  Yes   Patient/family provided with [facility] Clinical Social Work Department's list of facilities offering this level of care within the geographic area requested by the patient (or if unable, by the patient's family).  Yes   Patient/family informed of their freedom to choose among providers that offer the needed level of care, that participate in Medicare, Medicaid or managed care program needed by the patient, have an available bed and are willing to accept the patient.  Yes   Patient/family informed of [facility]'s ownership interest in [facility] and [facility], as well as of the fact that they are under no obligation to receive care at these facilities.  PASRR submitted to EDS on       PASRR number received on       Existing PASRR number confirmed on 01/24/16     FL2 transmitted to all facilities in geographic area requested by pt/family on 01/24/16     FL2 transmitted to all facilities within larger geographic area on       Patient informed that his/her managed care company has contracts with or will negotiate with certain facilities, including the following:        Yes   Patient/family informed of bed offers received.  Patient chooses bed at  [facility]     Physician recommends and patient chooses bed at      Patient to be transferred to  [facility] ) on 01/26/16.  Patient to be transferred to facility by  [facility] )     Patient family notified on 01/26/16 of transfer.  Name of family member notified:   (Patient's friend Bosie ClosJudith is aware of D/C today. )     PHYSICIAN       Additional  Comment:    _______________________________________________ Haig ProphetMorgan, Manus Weedman G, LCSW 01/26/2016, 1:14 PM

## 2016-01-26 NOTE — Discharge Instructions (Signed)
°  DIET:  °Cardiac diet and Diabetic diet ° °DISCHARGE CONDITION:  °Stable ° °ACTIVITY:  °Activity as tolerated ° °OXYGEN:  °Home Oxygen: No. °  °Oxygen Delivery: room air ° °DISCHARGE LOCATION:  °nursing home  ° °If you experience worsening of your admission symptoms, develop shortness of breath, life threatening emergency, suicidal or homicidal thoughts you must seek medical attention immediately by calling 911 or calling your MD immediately  if symptoms less severe. ° °You Must read complete instructions/literature along with all the possible adverse reactions/side effects for all the Medicines you take and that have been prescribed to you. Take any new Medicines after you have completely understood and accpet all the possible adverse reactions/side effects.  ° °Please note ° °You were cared for by a hospitalist during your hospital stay. If you have any questions about your discharge medications or the care you received while you were in the hospital after you are discharged, you can call the unit and asked to speak with the hospitalist on call if the hospitalist that took care of you is not available. Once you are discharged, your primary care physician will handle any further medical issues. Please note that NO REFILLS for any discharge medications will be authorized once you are discharged, as it is imperative that you return to your primary care physician (or establish a relationship with a primary care physician if you do not have one) for your aftercare needs so that they can reassess your need for medications and monitor your lab values. ° ° ° °

## 2016-01-26 NOTE — Progress Notes (Signed)
DISCHARGE NOTE:  [facility] [place]. EMS called for transportation.

## 2016-01-26 NOTE — Discharge Summary (Signed)
[facility] [place] at [facility]   PATIENT NAME: Stephanie Welch    MR#:  [id number]  DATE OF BIRTH:  1927-07-05  DATE OF ADMISSION:  01/22/2016 ADMITTING PHYSICIAN: Gery Pray, MD  DATE OF DISCHARGE: 01/26/2016  PRIMARY CARE PHYSICIAN: No primary care provider on file.   ADMISSION DIAGNOSIS:  Closed fracture of neck of right femur, initial encounter (HCC) [S72.001A]  DISCHARGE DIAGNOSIS:  Active Problems:   Femur fracture (HCC)   Hypothyroidism   HTN (hypertension)   Macular degeneration   SECONDARY DIAGNOSIS:   Past Medical History  Diagnosis Date  . Thyroid disease   . Hypertension   . Diabetes mellitus without complication (HCC)   . Macular degeneration   . Coronary artery disease      ADMITTING HISTORY  HPI: This is a very pleasant 82 year old female who states she was walking in her kitchen today with her cane when she developed pain in her left knee. The patient was severe enough it causes her to go down. She hit her right hip. She states she did not hit anything else. She was unable to stand. She lives in assisted care center was able to call first help and she was brought to the ER. She denies any history of coronary artery disease, chest pains with activity. She denies any shortness of breath. Her last surgery was approximately 30 years ago. She has not had any recent stress test. Patient is a very good historian, when asked what her diagnosis is therefore isn't answering, stating she is on a lot of medications which she is just given over to the nurses to give her.  HOSPITAL COURSE:   1. Right Hip Fracture POD - 2 Pain medications PRN  2. Acute on chronic systolic chf - Resolved Pulmonary Edema - Found on cxr. Echo shows mild reduced EF consistant with systolic CHF. She has been diuresed and is on B-blocker and ACE for appropriate treatment.  Presently on room air and doing well  3. Atrial Fib: Rate controlled. Held coumadin  for procedure.  Resumed Coumadin today.  4. Hypothyriodism: On synthroid.  Patient is working with physical therapy and can be discharged to skilled nursing facility for further rehabilitation today.  CONSULTS OBTAINED:  Treatment Team:  Christena Flake, MD  DRUG ALLERGIES:   Allergies  Allergen Reactions  . Conjugated Estrogens     Other reaction(s): Unknown    DISCHARGE MEDICATIONS:   Current Discharge Medication List    START taking these medications   Details  docusate sodium (COLACE) 100 MG capsule Take 1 capsule (100 mg total) by mouth 2 (two) times daily. Qty: 10 capsule, Refills: 0    enoxaparin (LOVENOX) 40 MG/0.4ML injection Inject 0.4 mLs (40 mg total) into the skin daily. Qty: 10 Syringe, Refills: 0      CONTINUE these medications which have CHANGED   Details  oxyCODONE-acetaminophen (PERCOCET) 5-325 MG tablet Take 2 tablets by mouth every 6 (six) hours as needed for moderate pain or severe pain. Qty: 30 tablet, Refills: 0      CONTINUE these medications which have NOT CHANGED   Details  citalopram (CELEXA) 10 MG tablet Take 10 mg by mouth daily.    COUMADIN 2 MG tablet Take 4-6 mg by mouth See admin instructions. Take 2 tablets (4mg ) by mouth on Monday, Tuesday, Wednesday, and Thursday. Take 3 tablets (6mg ) by mouth on Friday, Saturday, and Sunday    furosemide (LASIX) 20 MG tablet Take 20 mg by  mouth daily.    glimepiride (AMARYL) 2 MG tablet Take 2 mg by mouth every morning.    levothyroxine (SYNTHROID, LEVOTHROID) 100 MCG tablet Take 100 mcg by mouth daily before breakfast.    metFORMIN (GLUCOPHAGE) 500 MG tablet Take 500 mg by mouth 2 (two) times daily.    metoprolol succinate (TOPROL-XL) 50 MG 24 hr tablet Take 150 mg by mouth daily with breakfast.     Multiple Vitamins-Minerals (OCUVITE-LUTEIN PO) Take 1 tablet by mouth every morning.      STOP taking these medications     ramipril (ALTACE) 5 MG capsule         Today   VITAL SIGNS:   Blood pressure 119/66, pulse 89, temperature 98.5 F (36.9 C), temperature source Oral, resp. rate 20, height  (1.676 m), weight 82.101 kg (181 lb), SpO2 95 %.  I/O:   Intake/Output Summary (Last 24 hours) at 01/26/16 1208 Last data filed at 01/26/16 1057  Gross per 24 hour  Intake    480 ml  Output    300 ml  Net    180 ml    PHYSICAL EXAMINATION:  Physical Exam  GENERAL:  82 y.o.-year-old patient lying in the bed with no acute distress.  LUNGS: Normal breath sounds bilaterally, no wheezing, rales,rhonchi or crepitation. ABDOMEN: Soft, non-tender. NEUROLOGIC: Moves all 4 extremities. PSYCHIATRIC: The patient is alert and awake  DATA REVIEW:   CBC  Recent Labs Lab 01/25/16 0318  WBC 9.2  HGB 12.0  HCT 36.5  PLT 141*    Chemistries   Recent Labs Lab 01/26/16 0434  NA 133*  K 4.0  CL 103  CO2 23  GLUCOSE 108*  BUN 28*  CREATININE 0.92  CALCIUM 8.2*    Cardiac Enzymes No results for input(s): TROPONINI in the last 168 hours.  Microbiology Results  Results for orders placed or performed during the hospital encounter of 01/22/16  Surgical PCR screen     Status: None   Collection Time: 01/23/16  6:26 AM  Result Value Ref Range Status   MRSA, PCR NEGATIVE NEGATIVE Final   Staphylococcus aureus NEGATIVE NEGATIVE Final    Comment:        The Xpert SA Assay (FDA approved for NASAL specimens in patients over 27 years of age), is one component of a comprehensive surveillance program.  Test performance has been validated by [facility] for patients greater than or equal to 67 year old. It is not intended to diagnose infection nor to guide or monitor treatment.     RADIOLOGY:  No results found.  Follow up with PCP in 1 week.  Management plans discussed with the patient, family and they are in agreement.  CODE STATUS:     Code Status Orders        Start     Ordered   01/23/16 0203  Do not attempt resuscitation (DNR)   Continuous     Question Answer Comment  In the event of cardiac or respiratory ARREST Do not call a "code blue"   In the event of cardiac or respiratory ARREST Do not perform Intubation, CPR, defibrillation or ACLS   In the event of cardiac or respiratory ARREST Use medication by any route, position, wound care, and other measures to relive pain and suffering. May use oxygen, suction and manual treatment of airway obstruction as needed for comfort.      01/23/16 0202    Code Status History    Date Active Date Inactive  Code Status Order ID Comments User Context   This patient has a current code status but no historical code status.    Advance Directive Documentation        Most Recent Value   Type of Advance Directive  Healthcare Power of Attorney   Pre-existing out of facility DNR order (yellow form or pink MOST form)     "MOST" Form in Place?        TOTAL TIME TAKING CARE OF THIS PATIENT ON DAY OF DISCHARGE: more than 30 minutes.   Milagros LollSudini, Marshal Schrecengost R M.D on 01/26/2016 at 12:08 PM  Between 7am to 6pm - Pager - [phone]  After 6pm go to [email or web address] - password [email or web address] [facility]  [facility]  Office  [phone]  CC: Primary care physician; No primary care provider on file.  Note: This dictation was prepared with Dragon dictation along with smaller phrase technology. Any transcriptional errors that result from this process are unintentional.

## 2016-01-26 NOTE — Progress Notes (Signed)
Patient is medically stable for D/C to [facility] today. Per Kim admissions coordinator at [facility] will go to room 347. RN will call report at [phone] and arrange EMS for transport. Clinical Child psychotherapistocial Worker (CSW) sent D/C Summary, FL2 and D/C Packet to [employer] via [employer]. Patient is aware of above. CSW contacted patient's patient's friend Bosie ClosJudith and made her aware of above. CSW left patient's brother Chrissie NoaWilliam a voicemail making him aware of above. Please reconsult if future social work needs arise. CSW signing off.   Jetta LoutBailey Morgan, LCSW [phone]

## 2016-01-26 NOTE — Progress Notes (Signed)
Clinical Child psychotherapistocial Worker (CSW) received call from patient's brother Cyndia BentWilliam Coggins on yesterday asking for a medical update. Brother did have patient's password. CSW made brother aware that plan is for patient to D[facility]C [facility] and gave brother the phone number to the nurses station to call and get an update on patient's status. CSW will continue to follow and assist as needed.   Jetta LoutBailey Morgan, LCSW [phone]

## 2016-01-26 NOTE — Progress Notes (Signed)
Physical Therapy Treatment Patient Details Name: Stephanie Welch MRN: [id number] DOB: 04-11-1927 Today's Date: 01/26/2016    History of Present Illness Patient is an 82 y/o female that presents after sustaining a fall at her independent living facility. She sustained R hip fx with replacement.  Pt with macular degeneration.    PT Comments    Patient demonstrating improved pain tolerance today and ability to weight bear through RLE. She is quite limited with bed mobility and sit to stand transfer secondary to deconditioning/pain in her RLE, however it appears improved from previous sessions. She is progressing slowly towards established mobility goals.   Follow Up Recommendations  SNF     Equipment Recommendations  Rolling walker with 5" wheels    Recommendations for Other Services       Precautions / Restrictions Precautions Precautions: Fall Restrictions Weight Bearing Restrictions: Yes RLE Weight Bearing: Weight bearing as tolerated    Mobility  Bed Mobility Overal bed mobility: Needs Assistance Bed Mobility: Sit to Supine;Supine to Sit     Supine to sit: Mod assist;Max assist Sit to supine: Mod assist;Max assist   General bed mobility comments: Patient is able to bring her LLE towards the edge of bed, requires assistance for trunk and RLE.   Transfers Overall transfer level: Needs assistance Equipment used: Rolling walker (2 wheeled) Transfers: Sit to/from Stand Sit to Stand: Mod assist;Max assist         General transfer comment: Patient educated on hand placement, ultimately requires heavy assistance from PT to come to standing.   Ambulation/Gait             General Gait Details: Patient attempts to slide her feet, though no meaningful steps taken in this session secondary to weakness.    Stairs            Wheelchair Mobility    Modified Rankin (Stroke Patients Only)       Balance Overall balance assessment: Needs  assistance Sitting-balance support: Feet supported;Bilateral upper extremity supported Sitting balance-Leahy Scale: Fair     Standing balance support: Bilateral upper extremity supported Standing balance-Leahy Scale: Poor                      Cognition Arousal/Alertness: Awake/alert Behavior During Therapy: WFL for tasks assessed/performed Overall Cognitive Status: Within Functional Limits for tasks assessed                      Exercises Total Joint Exercises Ankle Circles/Pumps: AROM;10 reps;Both Short Arc Quad: AROM;Right;20 reps Heel Slides: AAROM;Both;15 reps Hip ABduction/ADduction: AAROM;Both;15 reps Straight Leg Raises: AAROM;Left;10 reps Long Arc Quad: AAROM;Both;20 reps    General Comments        Pertinent Vitals/Pain Pain Score:  (Patient reports pain is much less than she expected, and much less than yesterday.)    Home Living                      Prior Function            PT Goals (current goals can now be found in the care plan section) Acute Rehab PT Goals Patient Stated Goal: Get back to walking PT Goal Formulation: With patient Time For Goal Achievement: 02/08/16 Potential to Achieve Goals: Fair Progress towards PT goals: Progressing toward goals    Frequency  BID    PT Plan Current plan remains appropriate    Co-evaluation  End of Session Equipment Utilized During Treatment: Gait belt Activity Tolerance: Patient limited by pain;Patient limited by fatigue Patient left: with call bell/phone within reach;with bed alarm set;in bed     Time: [id number] PT Time Calculation (min) (ACUTE ONLY): 32 min  Charges:  $Gait Training: 8-22 mins $Therapeutic Exercise: 8-22 mins                    G Codes:      Kerin RansomPatrick A Lebert Lovern, PT, DPT    01/26/2016, 1:20 PM

## 2016-01-26 NOTE — Progress Notes (Signed)
Subjective: 2 Days Post-Op Procedure(s) (LRB): ARTHROPLASTY BIPOLAR HIP (HEMIARTHROPLASTY) (Right) Patient reports pain as mild. Patient is well, and has had no acute complaints or problems We will start therapy today.  Plan is to go Rehab after hospital stay. no nausea and no vomiting Patient denies any chest pains or shortness of breath.  Objective: Vital signs in last 24 hours: Temp:  [97.5 F (36.4 C)-98.5 F (36.9 C)] 98.5 F (36.9 C) (05/31 0727) Pulse Rate:  [72-97] 72 (05/31 0727) Resp:  [14-20] 20 (05/31 0727) BP: (108-135)/(62-87) 135/87 mmHg (05/31 0727) SpO2:  [92 %-99 %] 96 % (05/31 0727) well approximated incision Heels are non tender and elevated off the bed using rolled towels Intake/Output from previous day: 05/30 0701 - 05/31 0700 In: 480 [P.O.:480] Out: 375 [Urine:375] Intake/Output this shift:     Recent Labs  01/24/16 0404 01/25/16 0318  HGB 12.6 12.0    Recent Labs  01/24/16 0404 01/25/16 0318  WBC 8.5 9.2  RBC 3.95 3.77*  HCT 38.6 36.5  PLT 151 141*    Recent Labs  01/25/16 0318 01/26/16 0434  NA 134* 133*  K 4.0 4.0  CL 105 103  CO2 23 23  BUN 23* 28*  CREATININE 1.01* 0.92  GLUCOSE 163* 108*  CALCIUM 8.1* 8.2*    Recent Labs  01/24/16 0404 01/26/16 0434  INR 1.46 1.76    EXAM General - Patient is Alert, Appropriate and Oriented.  Extremity - Neurologically intact Neurovascular intact Sensation intact distally Intact pulses distally Dorsiflexion/Plantar flexion intact Compartment soft Dressing - scant drainage, bloody drainage Motor Function - intact, moving foot and toes well on exam.    Past Medical History  Diagnosis Date  . Thyroid disease   . Hypertension   . Diabetes mellitus without complication (HCC)   . Macular degeneration   . Coronary artery disease     Assessment/Plan: 2 Days Post-Op Procedure(s) (LRB): ARTHROPLASTY BIPOLAR HIP (HEMIARTHROPLASTY) (Right) Active Problems:   Femur fracture  (HCC)   Hypothyroidism   HTN (hypertension)   Macular degeneration  Estimated body mass index is 29.23 kg/(m^2) as calculated from the following:   Height as of this encounter: 5\' 6"  (1.676 m).   Weight as of this encounter: 82.101 kg (181 lb). Advance diet Up with therapy D/C IV fluids Discharge to SNF  Labs: were reviewed, Na 133 this AM Dressing change today. DVT Prophylaxis - Lovenox, TED hose and compression leg wraps bilaterally  Weight-Bearing as tolerated to right leg Begin working on a bowel movement.  FLEET enema as well as suppositories are on board PRN.   F/u with Dr. Joice LoftsPoggi in 6 weeks, staples removed in two weeks. Continue lovenox 30mg  q12hrs for 2 weeks after discharged  J. Horris LatinoLance McGhee, PA-C [facility] 01/26/2016, 7:29 AM

## 2016-01-26 NOTE — Consult Note (Signed)
ANTICOAGULATION CONSULT NOTE - Initial Consult  Pharmacy Consult for warfarin Indication: atrial fibrillation  Allergies  Allergen Reactions  . Conjugated Estrogens     Other reaction(s): Unknown   Patient Measurements: Height: 5\' 6"  (167.6 cm) Weight: 181 lb (82.101 kg) IBW/kg (Calculated) : 59.3  Vital Signs: Temp: 98.5 F (36.9 C) (05/31 0727) Temp Source: Oral (05/31 0727) BP: 119/66 mmHg (05/31 0956) Pulse Rate: 89 (05/31 0956)  Labs:  Recent Labs  01/23/16 1753 01/24/16 0404 01/25/16 0318 01/26/16 0434  HGB  --  12.6 12.0  --   HCT  --  38.6 36.5  --   PLT  --  151 141*  --   LABPROT 21.0* 17.8*  --  20.5*  INR 1.82 1.46  --  1.76  CREATININE  --  0.89 1.01* 0.92   Estimated Creatinine Clearance: 46.5 mL/min (by C-G formula based on Cr of 0.92).  Medical History: Past Medical History  Diagnosis Date  . Thyroid disease   . Hypertension   . Diabetes mellitus without complication (HCC)   . Macular degeneration   . Coronary artery disease    Medications:  Scheduled:  . Chlorhexidine Gluconate Cloth  6 each Topical Q0600  . citalopram  10 mg Oral Daily  . docusate sodium  100 mg Oral BID  . enoxaparin (LOVENOX) injection  40 mg Subcutaneous Q24H  . insulin aspart  0-15 Units Subcutaneous TID WC  . insulin aspart  0-5 Units Subcutaneous QHS  . levothyroxine  100 mcg Oral QAC breakfast  . metoprolol succinate  150 mg Oral Q breakfast  . pantoprazole  40 mg Oral BID  . Warfarin - Pharmacist Dosing Inpatient   Does not apply q1800    Assessment: Pt is a 82 year old female with PMH of Afib. Pt is s/p arthroplasty of hip. Pt received 2mg  IV of vit K on 5/28. Warfarin was held from 5/27 - 5/29.   Pt is currently on lovenox 40 q 24 hours which can continue until INR is therapeutic per MD.  Pt home dose is 4mg  on Mon, tue, wed, thus and 6mg  on Fri, sat and Sunday.   INR of 1.76 remains subtherapeutic today but has increased from yesterday.  Dosing  history: Date INR Dose 5/30 1.46 6 mg 5/31 1.76 4 mg  Goal of Therapy:  INR 2-3 Monitor platelets by anticoagulation protocol: Yes   Plan:  Will resume home dosing regimen and give warfarin 4 mg dose tonight @ 1800.  Will monitor INR with AM labs daily. Thank you for the consult.  Cindi CarbonMary M Laiyah Exline, PharmD Clinical Pharmacist 01/26/2016,10:37 AM

## 2016-02-02 ENCOUNTER — Encounter
Admission: RE | Admit: 2016-02-02 | Discharge: 2016-02-02 | Disposition: A | Discharge status: Home / Self Care | Payer: Medicare Other | Source: Ambulatory Visit | Attending: Internal Medicine | Admitting: Internal Medicine

## 2016-02-02 LAB — PROTIME-INR
INR: 2.28
PROTHROMBIN TIME: 24.9 s — AB (ref 11.4–15.0)

## 2016-02-02 LAB — GLUCOSE, CAPILLARY
GLUCOSE-CAPILLARY: 74 mg/dL (ref 65–99)
GLUCOSE-CAPILLARY: 86 mg/dL (ref 65–99)

## 2016-02-03 LAB — GLUCOSE, CAPILLARY
Glucose-Capillary: 116 mg/dL — ABNORMAL HIGH (ref 65–99)
Glucose-Capillary: 130 mg/dL — ABNORMAL HIGH (ref 65–99)

## 2016-02-04 LAB — GLUCOSE, CAPILLARY
GLUCOSE-CAPILLARY: 106 mg/dL — AB (ref 65–99)
GLUCOSE-CAPILLARY: 127 mg/dL — AB (ref 65–99)

## 2016-02-05 LAB — GLUCOSE, CAPILLARY
GLUCOSE-CAPILLARY: 97 mg/dL (ref 65–99)
Glucose-Capillary: 90 mg/dL (ref 65–99)

## 2016-02-06 LAB — GLUCOSE, CAPILLARY
Glucose-Capillary: 100 mg/dL — ABNORMAL HIGH (ref 65–99)
Glucose-Capillary: 104 mg/dL — ABNORMAL HIGH (ref 65–99)

## 2016-02-07 LAB — GLUCOSE, CAPILLARY
GLUCOSE-CAPILLARY: 108 mg/dL — AB (ref 65–99)
GLUCOSE-CAPILLARY: 108 mg/dL — AB (ref 65–99)

## 2016-02-08 LAB — GLUCOSE, CAPILLARY
Glucose-Capillary: 95 mg/dL (ref 65–99)
Glucose-Capillary: 91 mg/dL (ref 65–99)

## 2016-02-08 LAB — PROTIME-INR
INR: 2.29
Prothrombin Time: 25 seconds — ABNORMAL HIGH (ref 11.4–15.0)

## 2016-02-09 LAB — GLUCOSE, CAPILLARY
GLUCOSE-CAPILLARY: 117 mg/dL — AB (ref 65–99)
Glucose-Capillary: 109 mg/dL — ABNORMAL HIGH (ref 65–99)

## 2016-02-10 LAB — GLUCOSE, CAPILLARY
GLUCOSE-CAPILLARY: 103 mg/dL — AB (ref 65–99)
GLUCOSE-CAPILLARY: 128 mg/dL — AB (ref 65–99)

## 2016-02-11 LAB — GLUCOSE, CAPILLARY
Glucose-Capillary: 108 mg/dL — ABNORMAL HIGH (ref 65–99)
Glucose-Capillary: 138 mg/dL — ABNORMAL HIGH (ref 65–99)

## 2016-02-12 LAB — GLUCOSE, CAPILLARY
Glucose-Capillary: 106 mg/dL — ABNORMAL HIGH (ref 65–99)
Glucose-Capillary: 78 mg/dL (ref 65–99)

## 2016-02-13 LAB — GLUCOSE, CAPILLARY
GLUCOSE-CAPILLARY: 101 mg/dL — AB (ref 65–99)
GLUCOSE-CAPILLARY: 115 mg/dL — AB (ref 65–99)

## 2016-02-14 LAB — GLUCOSE, CAPILLARY
GLUCOSE-CAPILLARY: 107 mg/dL — AB (ref 65–99)
Glucose-Capillary: 125 mg/dL — ABNORMAL HIGH (ref 65–99)

## 2016-02-15 LAB — GLUCOSE, CAPILLARY
GLUCOSE-CAPILLARY: 98 mg/dL (ref 65–99)
GLUCOSE-CAPILLARY: 168 mg/dL — AB (ref 65–99)

## 2016-02-15 LAB — PROTIME-INR
INR: 2.33
Prothrombin Time: 25.3 seconds — ABNORMAL HIGH (ref 11.4–15.0)

## 2016-02-16 LAB — GLUCOSE, CAPILLARY
GLUCOSE-CAPILLARY: 100 mg/dL — AB (ref 65–99)
Glucose-Capillary: 128 mg/dL — ABNORMAL HIGH (ref 65–99)

## 2016-02-17 LAB — GLUCOSE, CAPILLARY
GLUCOSE-CAPILLARY: 100 mg/dL — AB (ref 65–99)
Glucose-Capillary: 87 mg/dL (ref 65–99)

## 2016-02-18 LAB — GLUCOSE, CAPILLARY
GLUCOSE-CAPILLARY: 109 mg/dL — AB (ref 65–99)
GLUCOSE-CAPILLARY: 125 mg/dL — AB (ref 65–99)

## 2016-02-19 LAB — GLUCOSE, CAPILLARY
GLUCOSE-CAPILLARY: 105 mg/dL — AB (ref 65–99)
GLUCOSE-CAPILLARY: 90 mg/dL (ref 65–99)

## 2016-02-20 LAB — GLUCOSE, CAPILLARY
GLUCOSE-CAPILLARY: 108 mg/dL — AB (ref 65–99)
GLUCOSE-CAPILLARY: 109 mg/dL — AB (ref 65–99)

## 2016-02-21 LAB — GLUCOSE, CAPILLARY
GLUCOSE-CAPILLARY: 100 mg/dL — AB (ref 65–99)
GLUCOSE-CAPILLARY: 70 mg/dL (ref 65–99)

## 2016-02-22 LAB — GLUCOSE, CAPILLARY
GLUCOSE-CAPILLARY: 99 mg/dL (ref 65–99)
GLUCOSE-CAPILLARY: 86 mg/dL (ref 65–99)

## 2016-02-22 LAB — PROTIME-INR
INR: 2.18
Prothrombin Time: 24.1 seconds — ABNORMAL HIGH (ref 11.4–15.0)

## 2016-02-23 LAB — GLUCOSE, CAPILLARY
Glucose-Capillary: 97 mg/dL (ref 65–99)
Glucose-Capillary: 68 mg/dL (ref 65–99)

## 2016-02-24 LAB — PROTIME-INR
INR: 2.32
Prothrombin Time: 25.2 seconds — ABNORMAL HIGH (ref 11.4–15.0)

## 2016-02-24 LAB — GLUCOSE, CAPILLARY
GLUCOSE-CAPILLARY: 111 mg/dL — AB (ref 65–99)
Glucose-Capillary: 74 mg/dL (ref 65–99)

## 2016-02-25 LAB — GLUCOSE, CAPILLARY
Glucose-Capillary: 120 mg/dL — ABNORMAL HIGH (ref 65–99)
Glucose-Capillary: 129 mg/dL — ABNORMAL HIGH (ref 65–99)

## 2016-02-26 ENCOUNTER — Encounter
Admission: RE | Admit: 2016-02-26 | Discharge: 2016-02-26 | Disposition: A | Discharge status: Home / Self Care | Payer: Medicare Other | Source: Ambulatory Visit | Attending: Internal Medicine | Admitting: Internal Medicine

## 2016-02-26 DIAGNOSIS — I4891 Unspecified atrial fibrillation: Secondary | ICD-10-CM | POA: Insufficient documentation

## 2016-02-26 DIAGNOSIS — E119 Type 2 diabetes mellitus without complications: Secondary | ICD-10-CM | POA: Insufficient documentation

## 2016-02-26 LAB — GLUCOSE, CAPILLARY: Glucose-Capillary: 108 mg/dL — ABNORMAL HIGH (ref 65–99)

## 2016-02-27 LAB — GLUCOSE, CAPILLARY
Glucose-Capillary: 132 mg/dL — ABNORMAL HIGH (ref 65–99)
Glucose-Capillary: 98 mg/dL (ref 65–99)

## 2016-02-28 LAB — GLUCOSE, CAPILLARY: Glucose-Capillary: 101 mg/dL — ABNORMAL HIGH (ref 65–99)

## 2016-02-29 LAB — GLUCOSE, CAPILLARY: GLUCOSE-CAPILLARY: 107 mg/dL — AB (ref 65–99)

## 2016-03-01 LAB — GLUCOSE, CAPILLARY
GLUCOSE-CAPILLARY: 100 mg/dL — AB (ref 65–99)
GLUCOSE-CAPILLARY: 123 mg/dL — AB (ref 65–99)

## 2016-03-02 LAB — PROTIME-INR
INR: 2.02
Prothrombin Time: 22.7 seconds — ABNORMAL HIGH (ref 11.4–15.0)

## 2016-03-02 LAB — GLUCOSE, CAPILLARY
GLUCOSE-CAPILLARY: 104 mg/dL — AB (ref 65–99)
Glucose-Capillary: 90 mg/dL (ref 65–99)

## 2016-03-03 LAB — GLUCOSE, CAPILLARY
GLUCOSE-CAPILLARY: 113 mg/dL — AB (ref 65–99)
Glucose-Capillary: 104 mg/dL — ABNORMAL HIGH (ref 65–99)

## 2016-03-04 LAB — GLUCOSE, CAPILLARY
GLUCOSE-CAPILLARY: 82 mg/dL (ref 65–99)
Glucose-Capillary: 98 mg/dL (ref 65–99)

## 2016-03-05 LAB — GLUCOSE, CAPILLARY
GLUCOSE-CAPILLARY: 93 mg/dL (ref 65–99)
Glucose-Capillary: 90 mg/dL (ref 65–99)

## 2016-03-06 LAB — GLUCOSE, CAPILLARY
GLUCOSE-CAPILLARY: 108 mg/dL — AB (ref 65–99)
Glucose-Capillary: 66 mg/dL (ref 65–99)

## 2016-03-07 LAB — GLUCOSE, CAPILLARY
Glucose-Capillary: 97 mg/dL (ref 65–99)
Glucose-Capillary: 102 mg/dL — ABNORMAL HIGH (ref 65–99)

## 2016-03-08 LAB — GLUCOSE, CAPILLARY
GLUCOSE-CAPILLARY: 103 mg/dL — AB (ref 65–99)
GLUCOSE-CAPILLARY: 69 mg/dL (ref 65–99)

## 2016-03-09 LAB — GLUCOSE, CAPILLARY
Glucose-Capillary: 96 mg/dL (ref 65–99)
Glucose-Capillary: 129 mg/dL — ABNORMAL HIGH (ref 65–99)

## 2016-03-09 LAB — PROTIME-INR
INR: 1.43
Prothrombin Time: 17.5 seconds — ABNORMAL HIGH (ref 11.4–15.0)

## 2016-03-10 LAB — GLUCOSE, CAPILLARY: Glucose-Capillary: 110 mg/dL — ABNORMAL HIGH (ref 65–99)

## 2016-03-11 LAB — GLUCOSE, CAPILLARY
GLUCOSE-CAPILLARY: 103 mg/dL — AB (ref 65–99)
Glucose-Capillary: 94 mg/dL (ref 65–99)

## 2016-03-12 LAB — GLUCOSE, CAPILLARY
GLUCOSE-CAPILLARY: 144 mg/dL — AB (ref 65–99)
Glucose-Capillary: 89 mg/dL (ref 65–99)

## 2016-03-13 DIAGNOSIS — E119 Type 2 diabetes mellitus without complications: Secondary | ICD-10-CM | POA: Diagnosis not present

## 2016-03-13 DIAGNOSIS — I4891 Unspecified atrial fibrillation: Secondary | ICD-10-CM | POA: Diagnosis present

## 2016-03-13 LAB — GLUCOSE, CAPILLARY
Glucose-Capillary: 104 mg/dL — ABNORMAL HIGH (ref 65–99)
Glucose-Capillary: 113 mg/dL — ABNORMAL HIGH (ref 65–99)

## 2016-03-14 DIAGNOSIS — I4891 Unspecified atrial fibrillation: Secondary | ICD-10-CM | POA: Diagnosis not present

## 2016-03-14 LAB — PROTIME-INR
INR: 1.42
PROTHROMBIN TIME: 17.4 s — AB (ref 11.4–15.0)

## 2016-03-14 LAB — GLUCOSE, CAPILLARY
GLUCOSE-CAPILLARY: 131 mg/dL — AB (ref 65–99)
Glucose-Capillary: 108 mg/dL — ABNORMAL HIGH (ref 65–99)

## 2016-03-15 DIAGNOSIS — I4891 Unspecified atrial fibrillation: Secondary | ICD-10-CM | POA: Diagnosis not present

## 2016-03-15 LAB — GLUCOSE, CAPILLARY
GLUCOSE-CAPILLARY: 110 mg/dL — AB (ref 65–99)
Glucose-Capillary: 91 mg/dL (ref 65–99)

## 2016-03-16 DIAGNOSIS — I4891 Unspecified atrial fibrillation: Secondary | ICD-10-CM | POA: Diagnosis not present

## 2016-03-16 LAB — GLUCOSE, CAPILLARY
GLUCOSE-CAPILLARY: 130 mg/dL — AB (ref 65–99)
GLUCOSE-CAPILLARY: 116 mg/dL — AB (ref 65–99)

## 2016-03-17 DIAGNOSIS — I4891 Unspecified atrial fibrillation: Secondary | ICD-10-CM | POA: Diagnosis not present

## 2016-03-17 LAB — PROTIME-INR
INR: 1.55
PROTHROMBIN TIME: 18.6 s — AB (ref 11.4–15.0)

## 2016-03-17 LAB — GLUCOSE, CAPILLARY: Glucose-Capillary: 101 mg/dL — ABNORMAL HIGH (ref 65–99)

## 2016-03-18 DIAGNOSIS — I4891 Unspecified atrial fibrillation: Secondary | ICD-10-CM | POA: Diagnosis not present

## 2016-03-18 LAB — GLUCOSE, CAPILLARY
GLUCOSE-CAPILLARY: 115 mg/dL — AB (ref 65–99)
Glucose-Capillary: 151 mg/dL — ABNORMAL HIGH (ref 65–99)

## 2016-03-19 DIAGNOSIS — I4891 Unspecified atrial fibrillation: Secondary | ICD-10-CM | POA: Diagnosis not present

## 2016-03-19 LAB — GLUCOSE, CAPILLARY
Glucose-Capillary: 104 mg/dL — ABNORMAL HIGH (ref 65–99)
Glucose-Capillary: 100 mg/dL — ABNORMAL HIGH (ref 65–99)

## 2016-03-20 DIAGNOSIS — I4891 Unspecified atrial fibrillation: Secondary | ICD-10-CM | POA: Diagnosis not present

## 2016-03-20 LAB — GLUCOSE, CAPILLARY
GLUCOSE-CAPILLARY: 97 mg/dL (ref 65–99)
GLUCOSE-CAPILLARY: 118 mg/dL — AB (ref 65–99)

## 2016-03-21 DIAGNOSIS — I4891 Unspecified atrial fibrillation: Secondary | ICD-10-CM | POA: Diagnosis not present

## 2016-03-21 LAB — GLUCOSE, CAPILLARY
GLUCOSE-CAPILLARY: 135 mg/dL — AB (ref 65–99)
Glucose-Capillary: 150 mg/dL — ABNORMAL HIGH (ref 65–99)

## 2016-03-21 LAB — PROTIME-INR
INR: 1.91
PROTHROMBIN TIME: 21.8 s — AB (ref 11.4–15.0)

## 2016-03-22 DIAGNOSIS — I4891 Unspecified atrial fibrillation: Secondary | ICD-10-CM | POA: Diagnosis not present

## 2016-03-22 LAB — GLUCOSE, CAPILLARY
Glucose-Capillary: 94 mg/dL (ref 65–99)
Glucose-Capillary: 165 mg/dL — ABNORMAL HIGH (ref 65–99)

## 2016-03-23 DIAGNOSIS — I4891 Unspecified atrial fibrillation: Secondary | ICD-10-CM | POA: Diagnosis not present

## 2016-03-23 LAB — GLUCOSE, CAPILLARY
GLUCOSE-CAPILLARY: 103 mg/dL — AB (ref 65–99)
GLUCOSE-CAPILLARY: 163 mg/dL — AB (ref 65–99)

## 2016-03-24 DIAGNOSIS — I4891 Unspecified atrial fibrillation: Secondary | ICD-10-CM | POA: Diagnosis not present

## 2016-03-24 LAB — GLUCOSE, CAPILLARY
GLUCOSE-CAPILLARY: 113 mg/dL — AB (ref 65–99)
GLUCOSE-CAPILLARY: 117 mg/dL — AB (ref 65–99)

## 2016-03-25 DIAGNOSIS — I4891 Unspecified atrial fibrillation: Secondary | ICD-10-CM | POA: Diagnosis not present

## 2016-03-25 LAB — GLUCOSE, CAPILLARY
GLUCOSE-CAPILLARY: 109 mg/dL — AB (ref 65–99)
GLUCOSE-CAPILLARY: 165 mg/dL — AB (ref 65–99)

## 2016-03-26 DIAGNOSIS — I4891 Unspecified atrial fibrillation: Secondary | ICD-10-CM | POA: Diagnosis not present

## 2016-03-26 LAB — GLUCOSE, CAPILLARY: Glucose-Capillary: 110 mg/dL — ABNORMAL HIGH (ref 65–99)

## 2016-03-27 DIAGNOSIS — I4891 Unspecified atrial fibrillation: Secondary | ICD-10-CM | POA: Diagnosis not present

## 2016-03-27 LAB — GLUCOSE, CAPILLARY
GLUCOSE-CAPILLARY: 120 mg/dL — AB (ref 65–99)
Glucose-Capillary: 102 mg/dL — ABNORMAL HIGH (ref 65–99)

## 2016-03-28 ENCOUNTER — Encounter
Admission: RE | Admit: 2016-03-28 | Discharge: 2016-03-28 | Disposition: A | Discharge status: Home / Self Care | Payer: Medicare Other | Source: Ambulatory Visit | Attending: Internal Medicine | Admitting: Internal Medicine

## 2016-03-28 DIAGNOSIS — I4891 Unspecified atrial fibrillation: Secondary | ICD-10-CM | POA: Diagnosis present

## 2016-03-28 LAB — PROTIME-INR
INR: 1.94
PROTHROMBIN TIME: 22.4 s — AB (ref 11.4–15.2)

## 2016-03-28 LAB — GLUCOSE, CAPILLARY: Glucose-Capillary: 164 mg/dL — ABNORMAL HIGH (ref 65–99)

## 2016-03-29 LAB — GLUCOSE, CAPILLARY
Glucose-Capillary: 104 mg/dL — ABNORMAL HIGH (ref 65–99)
Glucose-Capillary: 162 mg/dL — ABNORMAL HIGH (ref 65–99)

## 2016-03-30 LAB — GLUCOSE, CAPILLARY
GLUCOSE-CAPILLARY: 102 mg/dL — AB (ref 65–99)
GLUCOSE-CAPILLARY: 114 mg/dL — AB (ref 65–99)

## 2016-03-31 LAB — GLUCOSE, CAPILLARY
GLUCOSE-CAPILLARY: 115 mg/dL — AB (ref 65–99)
Glucose-Capillary: 112 mg/dL — ABNORMAL HIGH (ref 65–99)

## 2016-04-01 LAB — GLUCOSE, CAPILLARY
GLUCOSE-CAPILLARY: 110 mg/dL — AB (ref 65–99)
Glucose-Capillary: 97 mg/dL (ref 65–99)

## 2016-04-02 LAB — GLUCOSE, CAPILLARY
GLUCOSE-CAPILLARY: 96 mg/dL (ref 65–99)
GLUCOSE-CAPILLARY: 118 mg/dL — AB (ref 65–99)

## 2016-04-03 LAB — GLUCOSE, CAPILLARY
GLUCOSE-CAPILLARY: 117 mg/dL — AB (ref 65–99)
Glucose-Capillary: 122 mg/dL — ABNORMAL HIGH (ref 65–99)

## 2016-04-04 DIAGNOSIS — I4891 Unspecified atrial fibrillation: Secondary | ICD-10-CM | POA: Diagnosis not present

## 2016-04-04 LAB — GLUCOSE, CAPILLARY
GLUCOSE-CAPILLARY: 111 mg/dL — AB (ref 65–99)
GLUCOSE-CAPILLARY: 109 mg/dL — AB (ref 65–99)

## 2016-04-04 LAB — PROTIME-INR
INR: 2.47
PROTHROMBIN TIME: 27.2 s — AB (ref 11.4–15.2)

## 2016-04-05 LAB — GLUCOSE, CAPILLARY
GLUCOSE-CAPILLARY: 111 mg/dL — AB (ref 65–99)
GLUCOSE-CAPILLARY: 105 mg/dL — AB (ref 65–99)

## 2016-04-06 LAB — GLUCOSE, CAPILLARY
GLUCOSE-CAPILLARY: 105 mg/dL — AB (ref 65–99)
GLUCOSE-CAPILLARY: 130 mg/dL — AB (ref 65–99)

## 2016-04-07 LAB — GLUCOSE, CAPILLARY
GLUCOSE-CAPILLARY: 94 mg/dL (ref 65–99)
Glucose-Capillary: 96 mg/dL (ref 65–99)

## 2016-04-08 LAB — GLUCOSE, CAPILLARY
GLUCOSE-CAPILLARY: 109 mg/dL — AB (ref 65–99)
GLUCOSE-CAPILLARY: 102 mg/dL — AB (ref 65–99)

## 2016-04-09 LAB — GLUCOSE, CAPILLARY
GLUCOSE-CAPILLARY: 112 mg/dL — AB (ref 65–99)
Glucose-Capillary: 102 mg/dL — ABNORMAL HIGH (ref 65–99)

## 2016-04-10 LAB — GLUCOSE, CAPILLARY
GLUCOSE-CAPILLARY: 99 mg/dL (ref 65–99)
Glucose-Capillary: 114 mg/dL — ABNORMAL HIGH (ref 65–99)

## 2016-04-11 DIAGNOSIS — I4891 Unspecified atrial fibrillation: Secondary | ICD-10-CM | POA: Diagnosis not present

## 2016-04-11 LAB — PROTIME-INR
INR: 2.1
Prothrombin Time: 23.9 seconds — ABNORMAL HIGH (ref 11.4–15.2)

## 2016-04-11 LAB — GLUCOSE, CAPILLARY
Glucose-Capillary: 95 mg/dL (ref 65–99)
Glucose-Capillary: 103 mg/dL — ABNORMAL HIGH (ref 65–99)

## 2016-04-12 LAB — GLUCOSE, CAPILLARY
Glucose-Capillary: 93 mg/dL (ref 65–99)
Glucose-Capillary: 93 mg/dL (ref 65–99)

## 2016-04-13 LAB — GLUCOSE, CAPILLARY: Glucose-Capillary: 98 mg/dL (ref 65–99)

## 2016-04-28 ENCOUNTER — Encounter: Admission: RE | Admit: 2016-04-28 | Payer: Medicare Other | Source: Ambulatory Visit | Admitting: Internal Medicine

## 2017-10-01 ENCOUNTER — Encounter: Payer: Self-pay | Admitting: Emergency Medicine

## 2017-10-01 ENCOUNTER — Emergency Department
Admission: EM | Admit: 2017-10-01 | Discharge: 2017-10-01 | Disposition: A | Discharge status: Home / Self Care | Payer: Medicare Other | Attending: Emergency Medicine | Admitting: Emergency Medicine

## 2017-10-01 ENCOUNTER — Other Ambulatory Visit: Payer: Self-pay

## 2017-10-01 DIAGNOSIS — Z79899 Other long term (current) drug therapy: Secondary | ICD-10-CM | POA: Diagnosis not present

## 2017-10-01 DIAGNOSIS — I2510 Atherosclerotic heart disease of native coronary artery without angina pectoris: Secondary | ICD-10-CM | POA: Insufficient documentation

## 2017-10-01 DIAGNOSIS — E119 Type 2 diabetes mellitus without complications: Secondary | ICD-10-CM | POA: Insufficient documentation

## 2017-10-01 DIAGNOSIS — I10 Essential (primary) hypertension: Secondary | ICD-10-CM | POA: Insufficient documentation

## 2017-10-01 DIAGNOSIS — Z96641 Presence of right artificial hip joint: Secondary | ICD-10-CM | POA: Insufficient documentation

## 2017-10-01 DIAGNOSIS — R791 Abnormal coagulation profile: Secondary | ICD-10-CM | POA: Diagnosis not present

## 2017-10-01 DIAGNOSIS — Z7984 Long term (current) use of oral hypoglycemic drugs: Secondary | ICD-10-CM | POA: Insufficient documentation

## 2017-10-01 DIAGNOSIS — E039 Hypothyroidism, unspecified: Secondary | ICD-10-CM | POA: Insufficient documentation

## 2017-10-01 DIAGNOSIS — Z7901 Long term (current) use of anticoagulants: Secondary | ICD-10-CM | POA: Diagnosis not present

## 2017-10-01 DIAGNOSIS — R799 Abnormal finding of blood chemistry, unspecified: Secondary | ICD-10-CM | POA: Diagnosis present

## 2017-10-01 LAB — BASIC METABOLIC PANEL
ANION GAP: 11 (ref 5–15)
BUN: 23 mg/dL — ABNORMAL HIGH (ref 6–20)
CALCIUM: 9.3 mg/dL (ref 8.9–10.3)
CO2: 23 mmol/L (ref 22–32)
Chloride: 102 mmol/L (ref 101–111)
Creatinine, Ser: 0.93 mg/dL (ref 0.44–1.00)
GFR, EST NON AFRICAN AMERICAN: 53 mL/min — AB (ref >60)
Glucose, Bld: 76 mg/dL (ref 65–99)
POTASSIUM: 4.5 mmol/L (ref 3.5–5.1)
Sodium: 136 mmol/L (ref 135–145)

## 2017-10-01 LAB — CBC WITH DIFFERENTIAL/PLATELET
BASOS PCT: 1 %
Basophils Absolute: 0.1 10*3/uL (ref 0–0.1)
EOS PCT: 2 %
Eosinophils Absolute: 0.1 10*3/uL (ref 0–0.7)
HEMATOCRIT: 39.3 % (ref 35.0–47.0)
Hemoglobin: 13 g/dL (ref 12.0–16.0)
LYMPHS PCT: 21 %
Lymphs Abs: 1.3 10*3/uL (ref 1.0–3.6)
MCH: 30.7 pg (ref 26.0–34.0)
MCHC: 33 g/dL (ref 32.0–36.0)
MCV: 93.2 fL (ref 80.0–100.0)
MONO ABS: 0.8 10*3/uL (ref 0.2–0.9)
Monocytes Relative: 13 %
NEUTROS ABS: 4 10*3/uL (ref 1.4–6.5)
Neutrophils Relative %: 63 %
PLATELETS: 299 10*3/uL (ref 150–440)
RBC: 4.22 MIL/uL (ref 3.80–5.20)
RDW: 14 % (ref 11.5–14.5)
WBC: 6.3 10*3/uL (ref 3.6–11.0)

## 2017-10-01 LAB — PROTIME-INR
INR: 7.59 — AB
Prothrombin Time: 63.8 seconds — ABNORMAL HIGH (ref 11.4–15.2)

## 2017-10-01 LAB — APTT: APTT: 123 s — AB (ref 24–36)

## 2017-10-01 MED ORDER — PHYTONADIONE 5 MG PO TABS
2.5000 mg | ORAL_TABLET | Freq: Once | ORAL | Status: AC
  Administered 2017-10-01: 2.5 mg via ORAL
  Filled 2017-10-01: qty 1

## 2017-10-01 NOTE — Discharge Instructions (Addendum)
Today your INR (which measures your coumadin) was elevated.  Please DO NOT TAKE YOUR COUMADIN FOR THE NEXT TWO DAYS and make sure you get it rechecked in 48 hours.  Return to the emergency department sooner for any concerns whatsoever.  It was a pleasure to take care of you today, and thank you for coming to our emergency department.  If you have any questions or concerns before leaving please ask the nurse to grab me and I'm more than happy to go through your aftercare instructions again.  If you were prescribed any opioid pain medication today such as Norco, Vicodin, Percocet, morphine, hydrocodone, or oxycodone please make sure you do not drive when you are taking this medication as it can alter your ability to drive safely.  If you have any concerns once you are home that you are not improving or are in fact getting worse before you can make it to your follow-up appointment, please do not hesitate to call 911 and come back for further evaluation.  Merrily BrittleNeil Jazminn Pomales, MD  Results for orders placed or performed during the hospital encounter of 10/01/17  CBC with Differential  Result Value Ref Range   WBC 6.3 3.6 - 11.0 K/uL   RBC 4.22 3.80 - 5.20 MIL/uL   Hemoglobin 13.0 12.0 - 16.0 g/dL   HCT 41.339.3 24.435.0 - 01.047.0 %   MCV 93.2 80.0 - 100.0 fL   MCH 30.7 26.0 - 34.0 pg   MCHC 33.0 32.0 - 36.0 g/dL   RDW 27.214.0 53.611.5 - 64.414.5 %   Platelets 299 150 - 440 K/uL   Neutrophils Relative % 63 %   Neutro Abs 4.0 1.4 - 6.5 K/uL   Lymphocytes Relative 21 %   Lymphs Abs 1.3 1.0 - 3.6 K/uL   Monocytes Relative 13 %   Monocytes Absolute 0.8 0.2 - 0.9 K/uL   Eosinophils Relative 2 %   Eosinophils Absolute 0.1 0 - 0.7 K/uL   Basophils Relative 1 %   Basophils Absolute 0.1 0 - 0.1 K/uL  Basic metabolic panel  Result Value Ref Range   Sodium 136 135 - 145 mmol/L   Potassium 4.5 3.5 - 5.1 mmol/L   Chloride 102 101 - 111 mmol/L   CO2 23 22 - 32 mmol/L   Glucose, Bld 76 65 - 99 mg/dL   BUN 23 (H) 6 - 20  mg/dL   Creatinine, Ser 0.340.93 0.44 - 1.00 mg/dL   Calcium 9.3 8.9 - 74.210.3 mg/dL   GFR calc non Af Amer 53 (L) >60 mL/min   GFR calc Af Amer >60 >60 mL/min   Anion gap 11 5 - 15  Protime-INR  Result Value Ref Range   Prothrombin Time 63.8 (H) 11.4 - 15.2 seconds   INR 7.59 (HH)   APTT  Result Value Ref Range   aPTT 123 (H) 24 - 36 seconds

## 2017-10-01 NOTE — ED Notes (Signed)
This RN called [facility] at [phone] who stated that a driver would be here in 15 minutes to pick pt up at ED entrance.

## 2017-10-01 NOTE — ED Triage Notes (Addendum)
Called by PCP to come to ED for evaluation due to abnormal blood work".   Patient does not know what blood work was abnormal.  Reviewed patietn notes and PT / INR, PTT were elevated.

## 2017-10-01 NOTE — ED Provider Notes (Signed)
[facility] Emergency Department Provider Note  ____________________________________________   First MD Initiated Contact with Patient 10/01/17 1555     (approximate)  I have reviewed the triage vital signs and the nursing notes.   HISTORY  Chief Complaint Abnormal Lab   HPI Stephanie Welch is a 84 y.o. female who comes to the emergency department because she was called by her primary care physician for a "abnormal lab".  She is not sure what lab was abnormal.  The patient herself has no medical complaints at this time.  She denies chest pain shortness of breath abdominal pain nausea vomiting.  She denies bleeding.  She says she knows she takes Coumadin but is not sure why.  She says she is never had heart surgery before.  Past Medical History:  Diagnosis Date  . Coronary artery disease   . Diabetes mellitus without complication (HCC)   . Hypertension   . Macular degeneration   . Thyroid disease     Patient Active Problem List   Diagnosis Date Noted  . Femur fracture (HCC) 01/23/2016  . Hypothyroidism 01/23/2016  . HTN (hypertension) 01/23/2016  . Macular degeneration 01/23/2016    Past Surgical History:  Procedure Laterality Date  . ABDOMINAL HYSTERECTOMY    . FRACTURE SURGERY    . HIP ARTHROPLASTY Right 01/24/2016   Procedure: ARTHROPLASTY BIPOLAR HIP (HEMIARTHROPLASTY);  Surgeon: Christena Flake, MD;  Location: [facility] ORS;  Service: Orthopedics;  Laterality: Right;    Prior to Admission medications   Medication Sig Start Date End Date Taking? Authorizing Provider  citalopram (CELEXA) 10 MG tablet Take 10 mg by mouth daily. 12/23/15   [provider]  COUMADIN 2 MG tablet Take 4-6 mg by mouth See admin instructions. Take 2 tablets (4mg ) by mouth on Monday, Tuesday, Wednesday, and Thursday. Take 3 tablets (6mg ) by mouth on Friday, Saturday, and Sunday 12/23/15   [provider]  docusate sodium (COLACE) 100 MG capsule Take 1 capsule  (100 mg total) by mouth 2 (two) times daily. 01/26/16   Milagros Loll, MD  enoxaparin (LOVENOX) 40 MG/0.4ML injection Inject 0.4 mLs (40 mg total) into the skin daily. 01/26/16   Milagros Loll, MD  furosemide (LASIX) 20 MG tablet Take 20 mg by mouth daily. 12/23/15   [provider]  glimepiride (AMARYL) 2 MG tablet Take 2 mg by mouth every morning. 12/23/15   [provider]  levothyroxine (SYNTHROID, LEVOTHROID) 100 MCG tablet Take 100 mcg by mouth daily before breakfast. 01/13/16   [provider]  metFORMIN (GLUCOPHAGE) 500 MG tablet Take 500 mg by mouth 2 (two) times daily. 12/23/15   [provider]  metoprolol succinate (TOPROL-XL) 50 MG 24 hr tablet Take 150 mg by mouth daily with breakfast.  12/23/15   [provider]  Multiple Vitamins-Minerals (OCUVITE-LUTEIN PO) Take 1 tablet by mouth every morning.    [provider]  oxyCODONE-acetaminophen (PERCOCET) 5-325 MG tablet Take 2 tablets by mouth every 6 (six) hours as needed for moderate pain or severe pain. 01/26/16   Milagros Loll, MD    Allergies Conjugated estrogens  No family history on file.  Social History Social History   Tobacco Use  . Smoking status: Never Smoker  . Smokeless tobacco: Never Used  Substance Use Topics  . Alcohol use: No  . Drug use: Not on file    Review of Systems Constitutional: No fever/chills Eyes: No visual changes. ENT: No sore throat. Cardiovascular: Denies chest pain. Respiratory:  Denies shortness of breath. Gastrointestinal: No abdominal pain.  No nausea, no vomiting.  No diarrhea.  No constipation. Genitourinary: Negative for dysuria. Musculoskeletal: Negative for back pain. Skin: Negative for rash. Neurological: Negative for headaches, focal weakness or numbness.   ____________________________________________   PHYSICAL EXAM:  VITAL SIGNS: ED Triage Vitals  Enc Vitals Group     BP 10/01/17 1415 113/62     Pulse Rate 10/01/17  1415 86     Resp 10/01/17 1415 16     Temp 10/01/17 1415 98.2 F (36.8 C)     Temp Source 10/01/17 1415 Oral     SpO2 10/01/17 1415 94 %     Weight 10/01/17 1413 170 lb (77.1 kg)     Height 10/01/17 1413 5\' 6"  (1.676 m)     Head Circumference --      Peak Flow --      Pain Score --      Pain Loc --      Pain Edu? --      Excl. in GC? --     Constitutional: Alert and oriented x4 hard of hearing but very well-appearing nontoxic no diaphoresis speaks in full clear sentences Eyes: PERRL EOMI. mid range and brisk Head: Atraumatic. Nose: No congestion/rhinnorhea. Mouth/Throat: No trismus Neck: No stridor.   Cardiovascular: Normal rate, regular rhythm. Grossly normal heart sounds.  Good peripheral circulation. Respiratory: Normal respiratory effort.  No retractions. Lungs CTAB and moving good air Gastrointestinal: Soft nontender Musculoskeletal: No lower extremity edema   Neurologic:  Normal speech and language. No gross focal neurologic deficits are appreciated. Skin:  Skin is warm, dry and intact. No rash noted. Psychiatric: Mood and affect are normal. Speech and behavior are normal.    ____________________________________________   DIFFERENTIAL includes but not limited to  Medication noncompliance, per therapeutic INR, dehydration, anemia ____________________________________________   LABS (all labs ordered are listed, but only abnormal results are displayed)  Labs Reviewed  BASIC METABOLIC PANEL - Abnormal; Notable for the following components:      Result Value   BUN 23 (*)    GFR calc non Af Amer 53 (*)    All other components within normal limits  PROTIME-INR - Abnormal; Notable for the following components:   Prothrombin Time 63.8 (*)    INR 7.59 (*)    All other components within normal limits  APTT - Abnormal; Notable for the following components:   aPTT 123 (*)    All other components within normal limits  CBC WITH DIFFERENTIAL/PLATELET    Lab work  reviewed by me with normal renal function.  Elevated INR at 7.6 __________________________________________  EKG   ____________________________________________  RADIOLOGY   ____________________________________________   PROCEDURES  Procedure(s) performed: no  Procedures  Critical Care performed: no  Observation: no ____________________________________________   INITIAL IMPRESSION / ASSESSMENT AND PLAN / ED COURSE  Pertinent labs & imaging results that were available during my care of the patient were reviewed by me and considered in my medical decision making (see chart for details).  The patient is well-appearing with no acute complaints.  On chart review she is anticoagulated for intermittent atrial fibrillation.  She has no evidence of active bleed.  According to CHEST guidelines the patient should have 2.5 mg of oral vitamin K and hold her Coumadin for 2 days with a 48-hour INR recheck.  Strict return precautions have been given and the patient verbalizes understanding and agreement with the plan.      ____________________________________________  FINAL CLINICAL IMPRESSION(S) / ED DIAGNOSES  Final diagnoses:  Abnormal INR      NEW MEDICATIONS STARTED DURING THIS VISIT:  New Prescriptions   No medications on file     Note:  This document was prepared using Dragon voice recognition software and may include unintentional dictation errors.     Merrily Brittleifenbark, Whittley Carandang, MD 10/01/17 [phone]

## 2017-12-11 IMAGING — CT CT HEAD W/O CM
1 series · 15 of 30 positions shown, 19 images · non-contrast
Comparison: None.

CLINICAL DATA: Pain following fall

EXAM:
CT HEAD WITHOUT CONTRAST
TECHNIQUE: Contiguous axial images were obtained from the base of the skull
through the vertex without intravenous contrast.

[Series 2: head wo · axial · 0.45mm/px · z∈[+43,+178]mm · 15 of 30 slices shown, 19 images]
[im 2/30  brain]
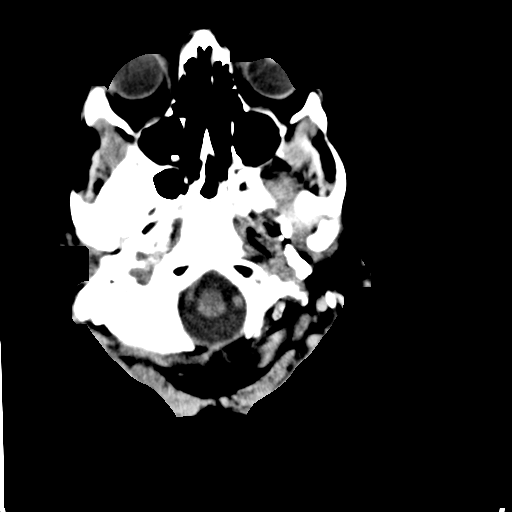
[im 2/30  bone]
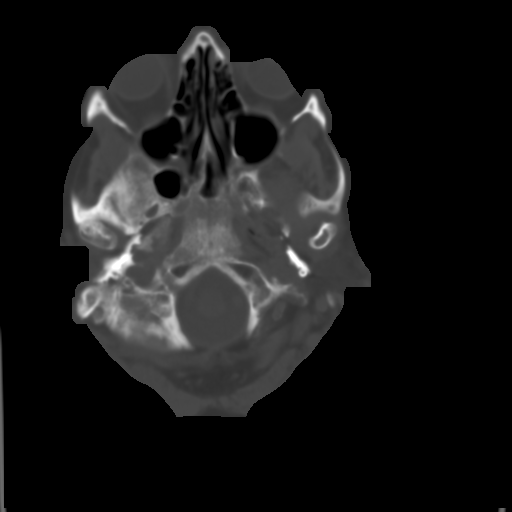
[im 4/30  brain]
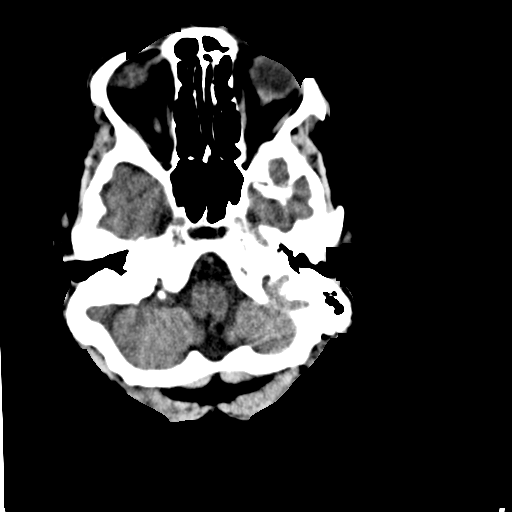
[im 6/30  brain]
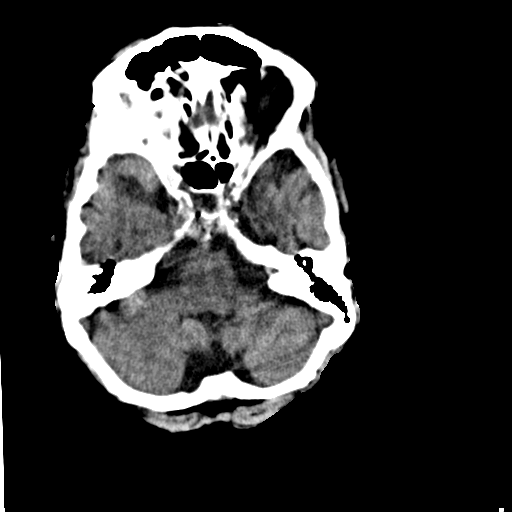
[im 8/30  brain]
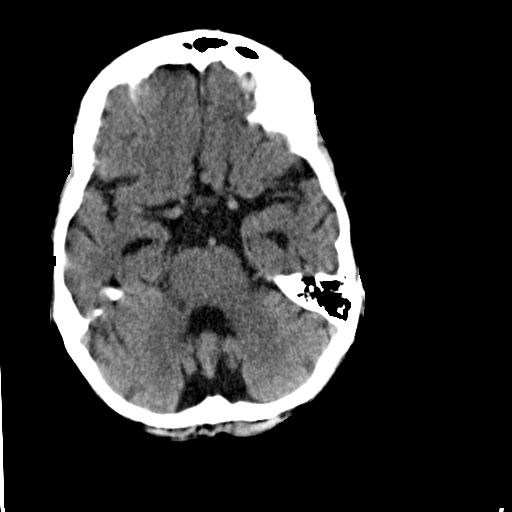
[im 10/30  brain]
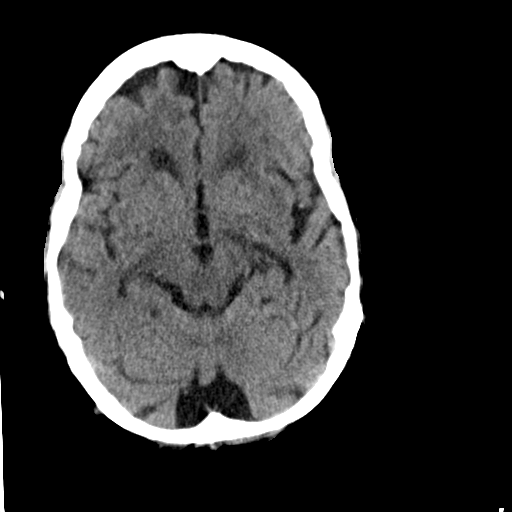
[im 10/30  bone]
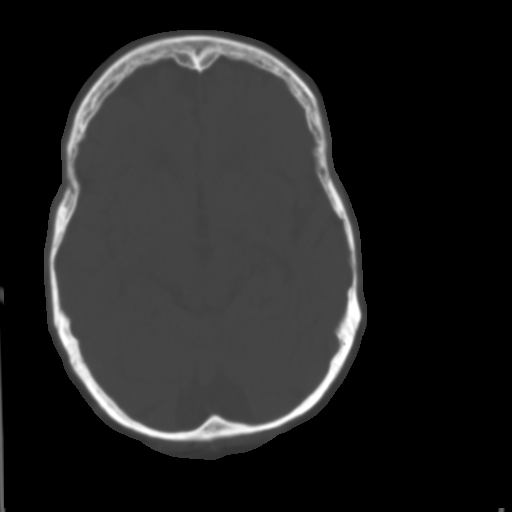
[im 12/30  brain]
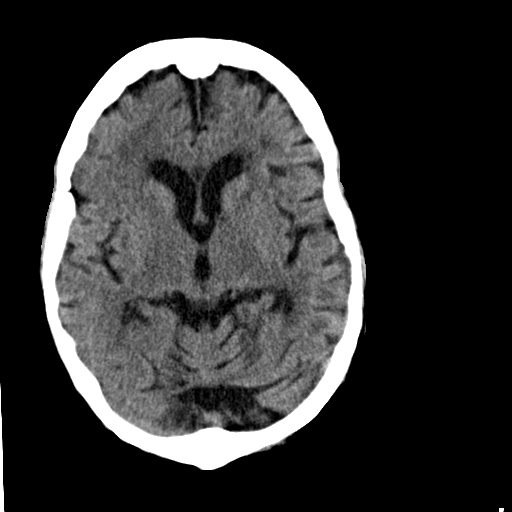
[im 14/30  brain]
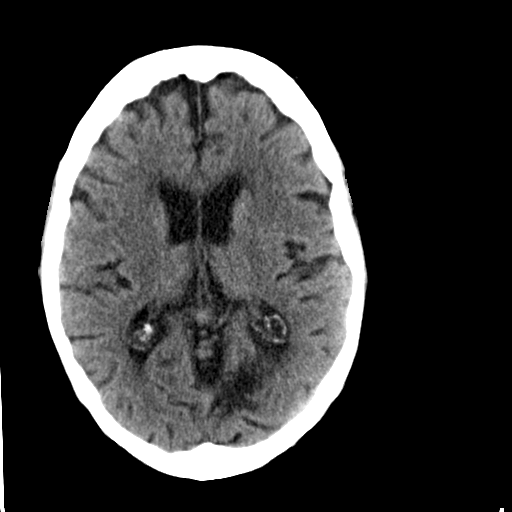
[im 16/30  brain]
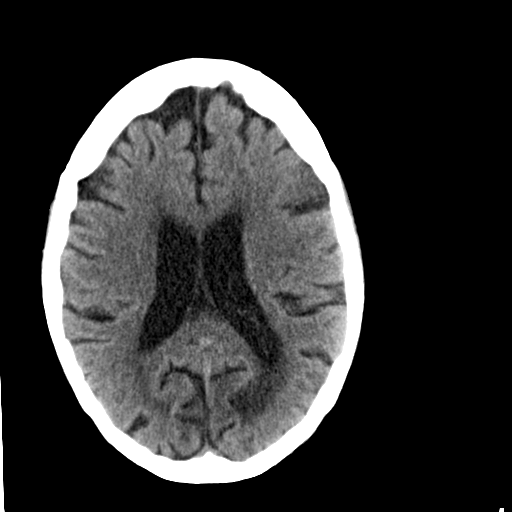
[im 17/30  brain]
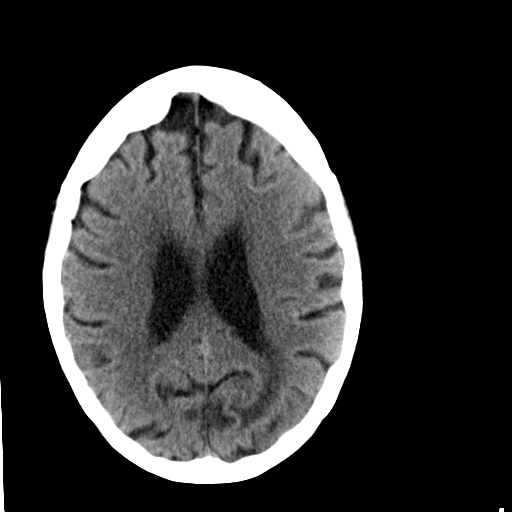
[im 17/30  bone]
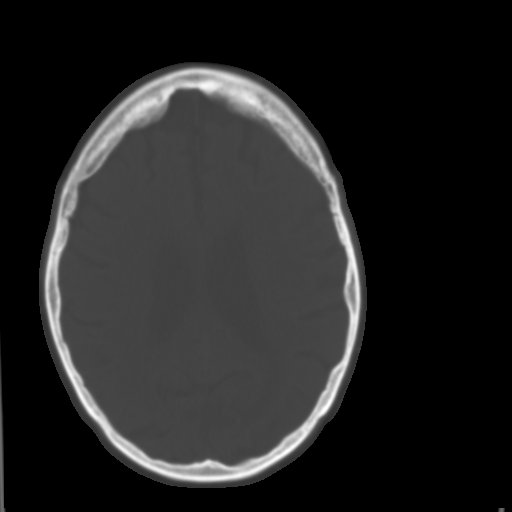
[im 19/30  brain]
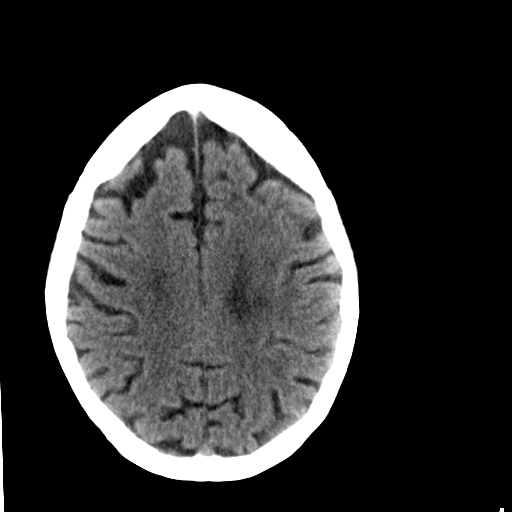
[im 21/30  brain]
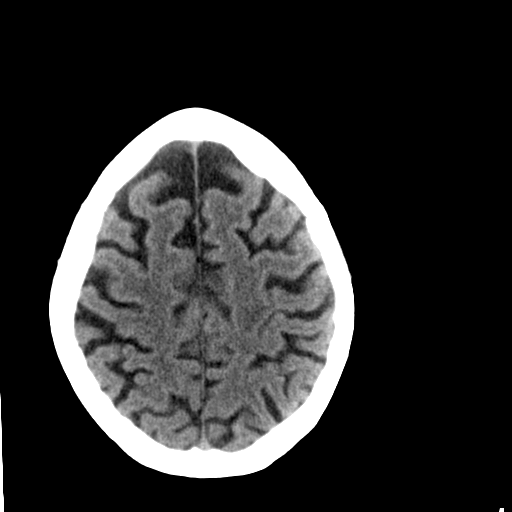
[im 23/30  brain]
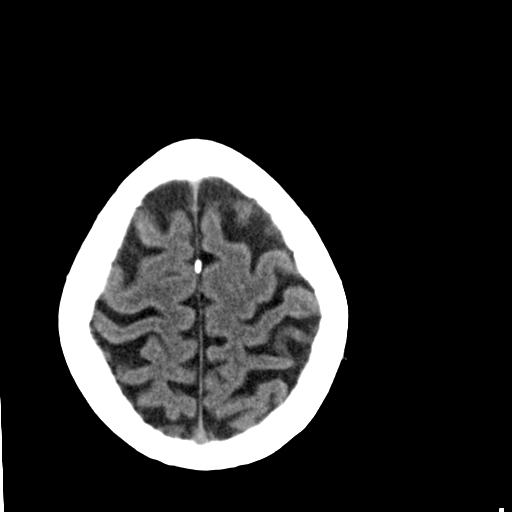
[im 25/30  brain]
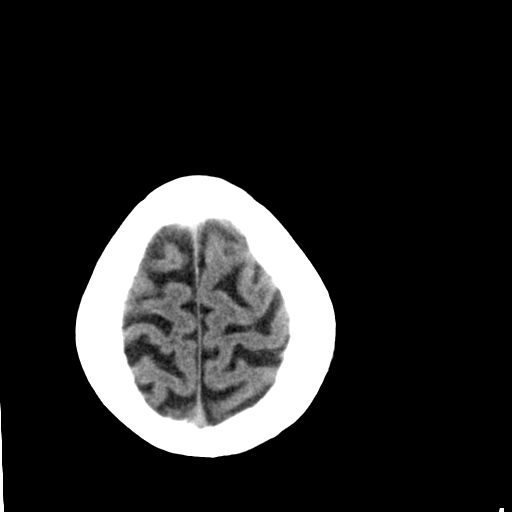
[im 25/30  bone]
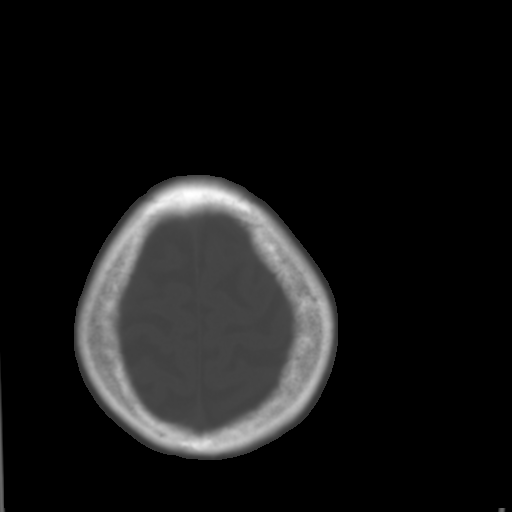
[im 27/30  brain]
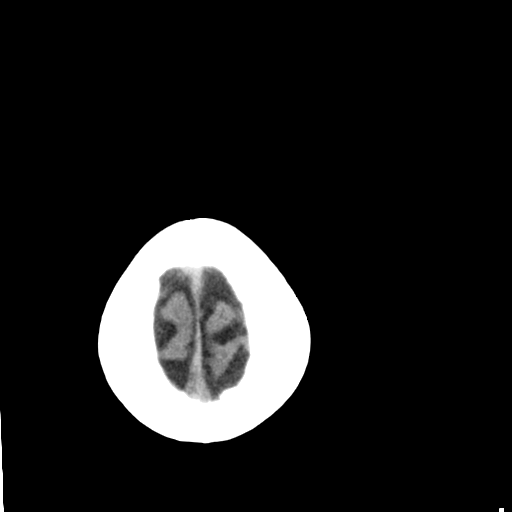
[im 29/30  brain]
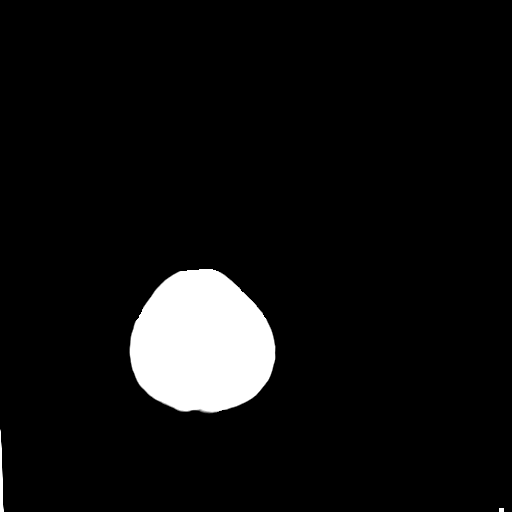

[15 of 30 positions shown; findings below may reference images not displayed]

FINDINGS: There is moderate diffuse atrophy. Prominence of the cisterna magna
is an anatomic variant. There is no demonstrable mass, hemorrhage,
extra-axial fluid collection, or midline shift. There is patchy
small vessel disease in the centra semiovale bilaterally. There is
evidence of a prior small infarct in the white matter of the
posterior superior left corona radiata. No acute infarct evident.
The bony calvarium appears intact. The mastoid air cells are clear.
No intraorbital lesions are apparent in the visualized intraorbital
regions. There is opacification of a posterior right ethmoid air
cell. There is also mucosal thickening in several anterior ethmoid
air cells bilaterally.
IMPRESSION: Atrophy with patchy supratentorial small vessel disease. No
intracranial mass, hemorrhage, or extra-axial fluid collection. No
acute infarct evident. Areas of ethmoid sinus disease.

## 2018-03-15 IMAGING — CR DG CHEST 1V
1 series · 1 of 1 positions shown · non-contrast
Comparison: PA and lateral views 01/16/2012

CLINICAL DATA: Preop, right hip fracture.

EXAM:
CHEST 1 VIEW

[chest ap]
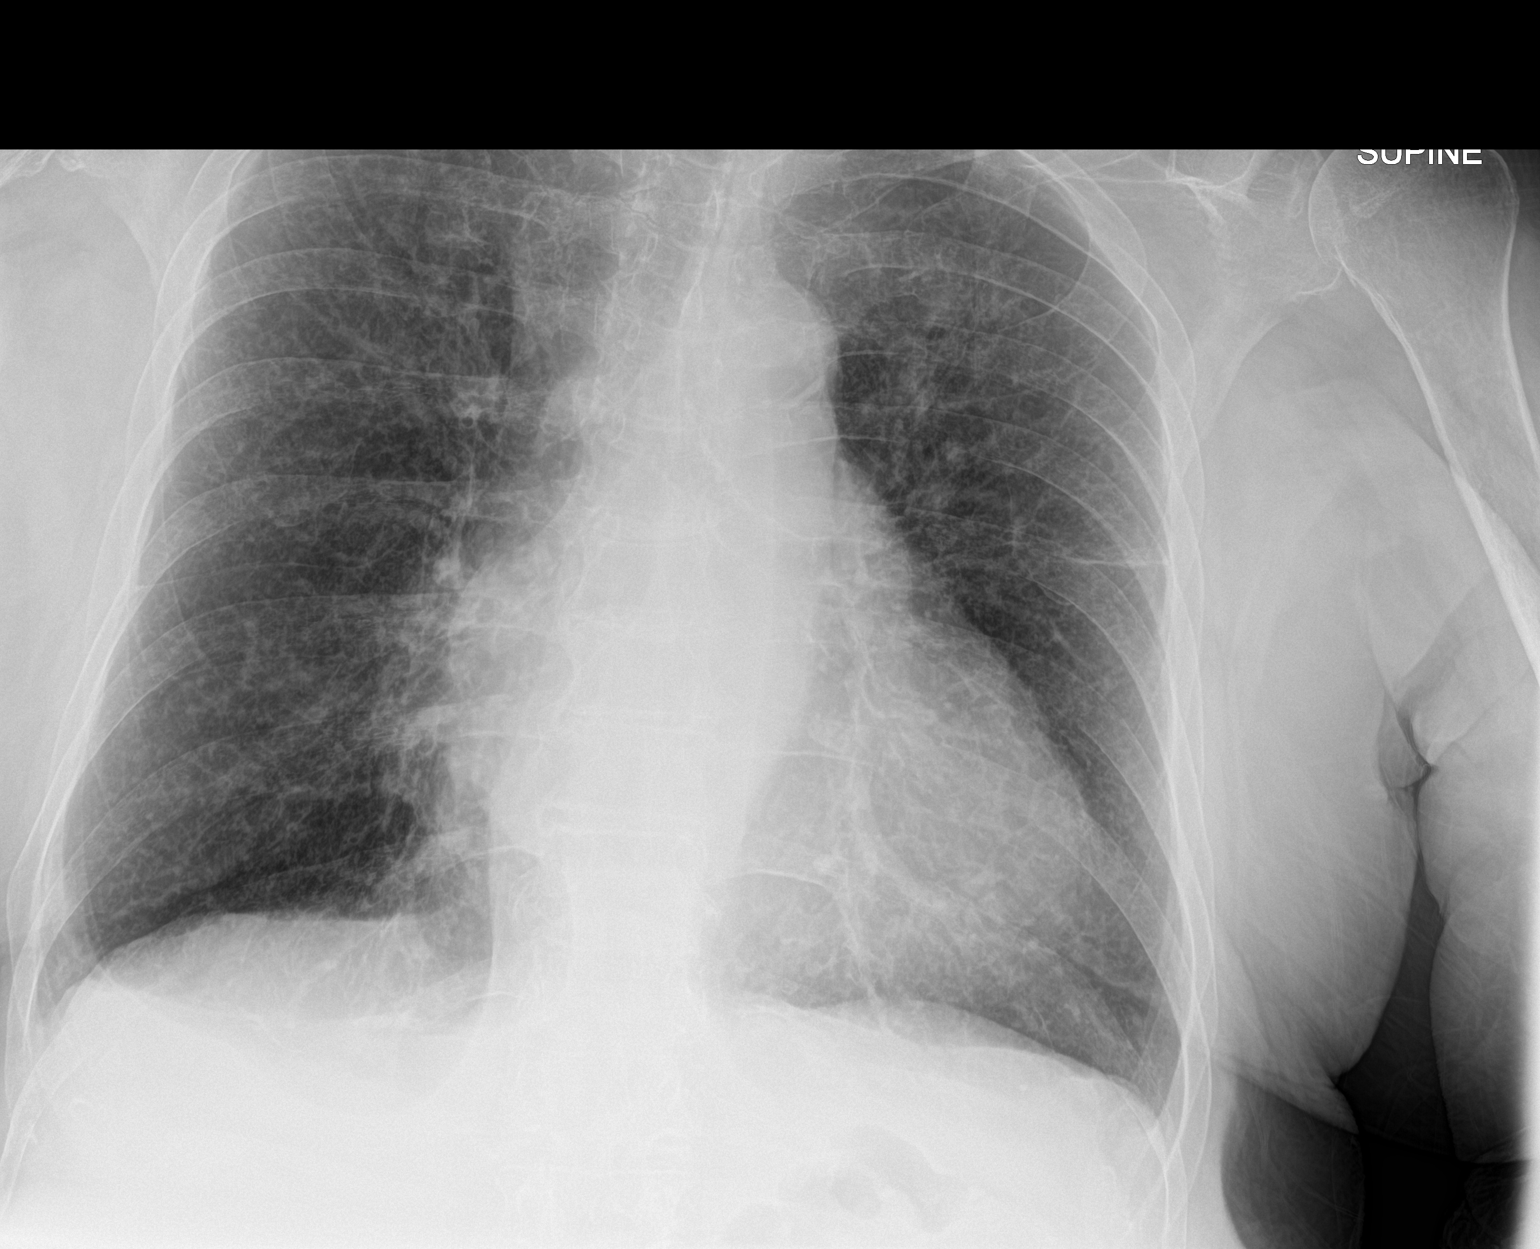

[1 of 1 positions shown; findings below may reference images not displayed]

FINDINGS: Go cardiomegaly has increased from exam 4 years prior. Interstitial
prominence suggesting pulmonary edema has also increased. The lungs
are mildly hyperinflated. There is blunting of both costophrenic
angles. Linear opacity in the left midlung zone is likely
atelectasis or scarring. No confluent airspace disease or
pneumothorax. No displaced rib fracture.
IMPRESSION: 1. Cardiomegaly, progressed from most recent comparison exam 4 years
prior. Increased interstitial markings, likely pulmonary edema.
Findings suggestive of low-grade CHF
2. Blunting of the costophrenic angles may be small effusions or
secondary to hyperinflation.

## 2018-03-17 IMAGING — DX DG HIP (WITH OR WITHOUT PELVIS) 1V PORT*R*
2 series · 2 of 2 positions shown · non-contrast
Comparison: 01/22/2016 right hip radiograph

CLINICAL DATA: Status post right hip hemiarthroplasty

EXAM:
DG HIP (WITH OR WITHOUT PELVIS) 1V PORT RIGHT

[pelvis ap]
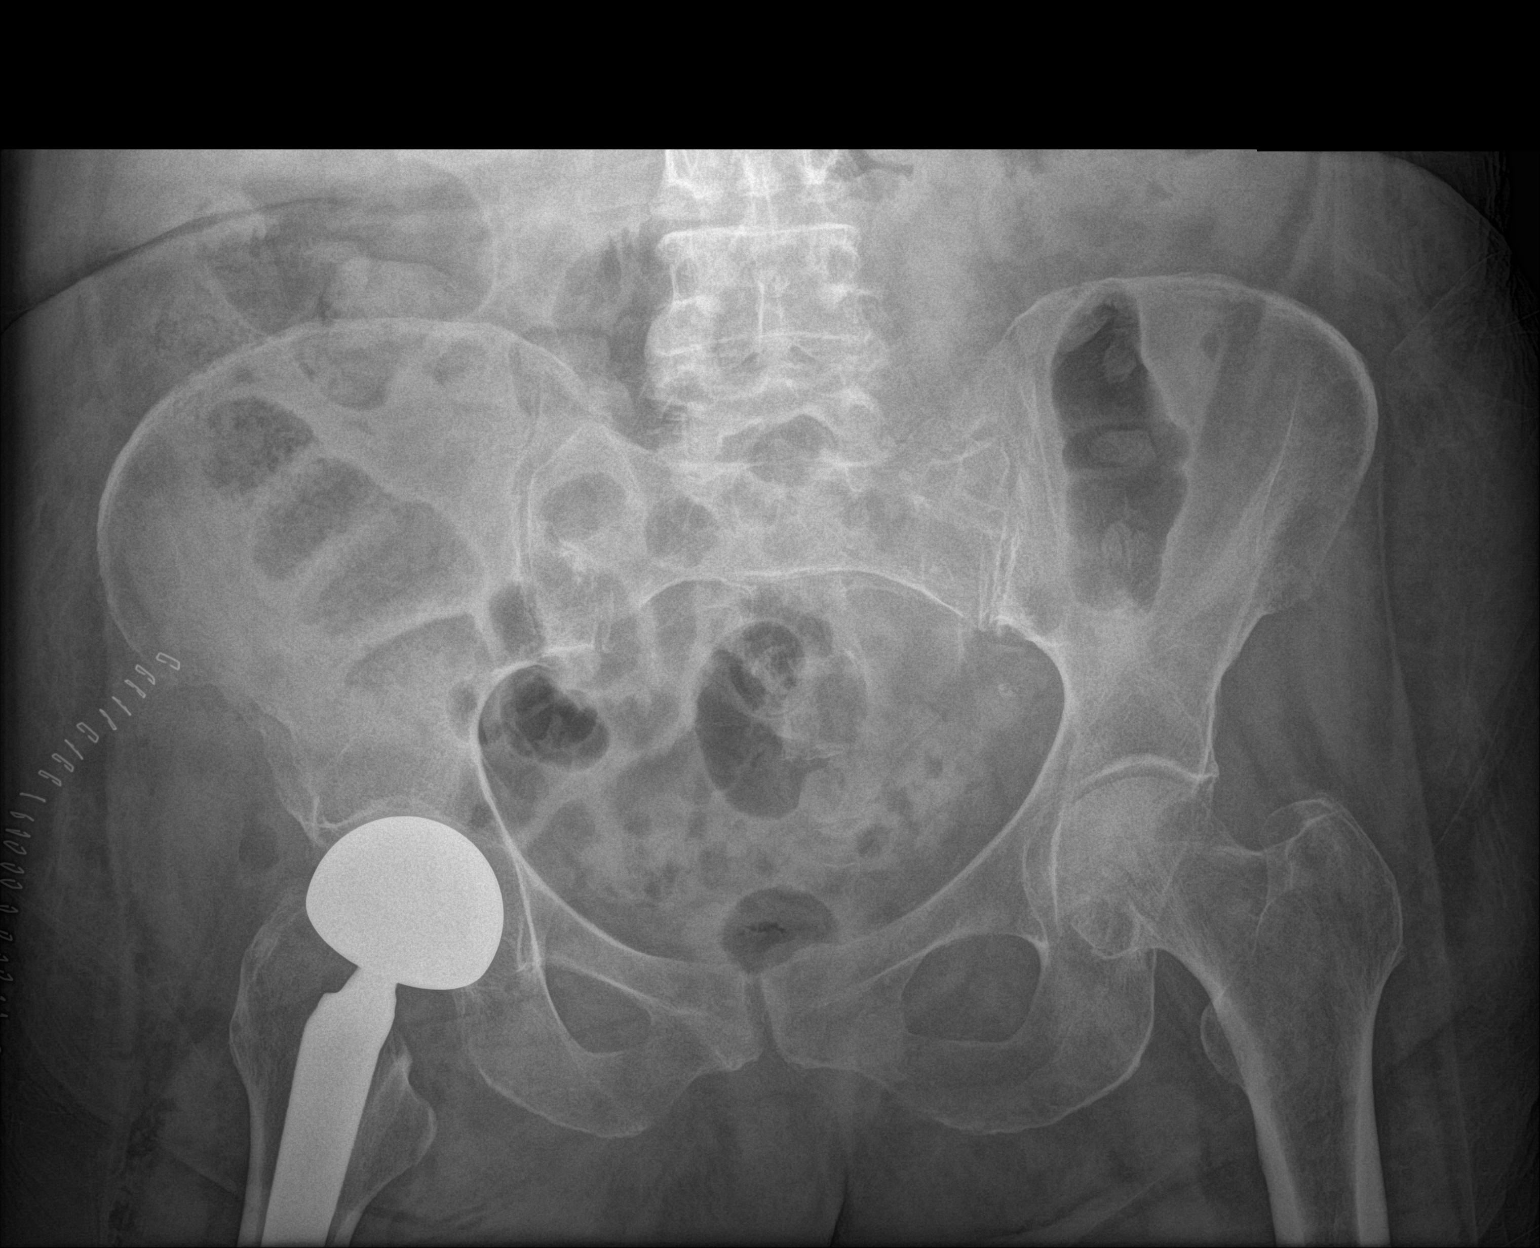

[hip lat]
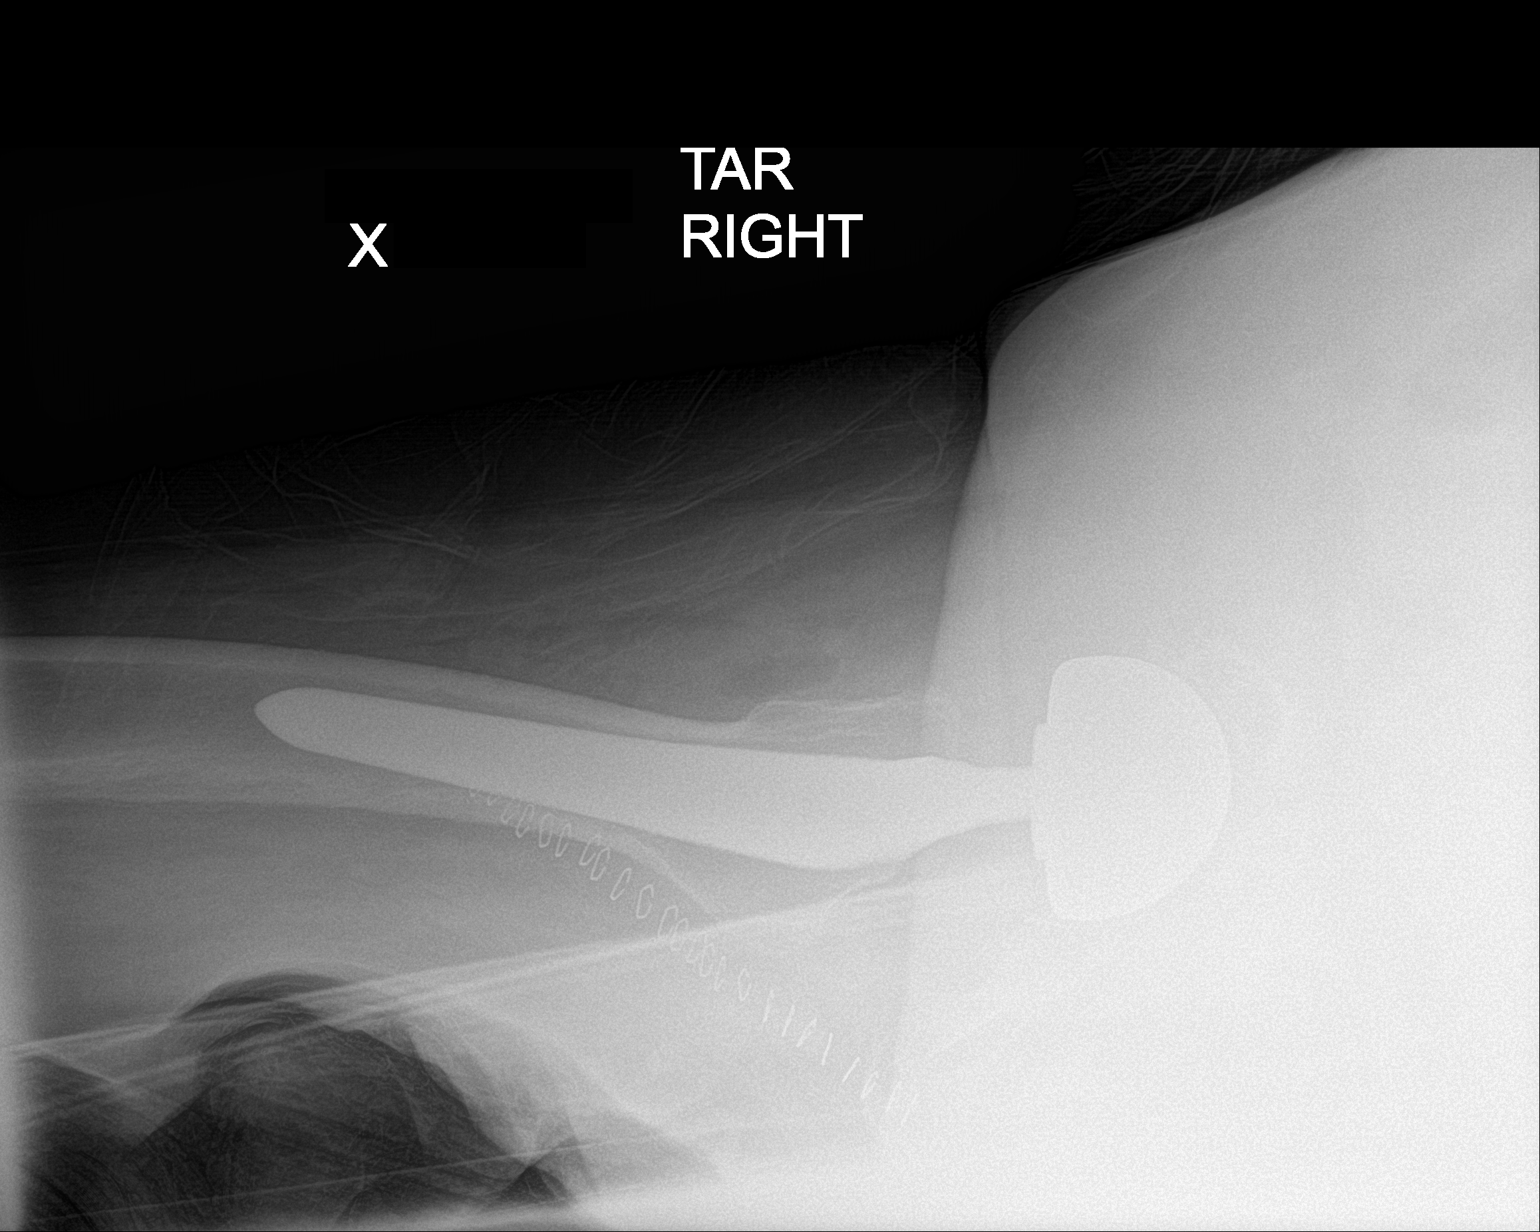

[2 of 2 positions shown; findings below may reference images not displayed]

FINDINGS: Status post right hip hemiarthroplasty with well-positioned right
proximal femoral prosthesis. No evidence of dislocation at the right
hip joint. No new osseous fracture. No suspicious focal osseous
lesion. Expected soft tissue gas within and surrounding the right
hip. Skin staples lateral to the right hip.
IMPRESSION: Satisfactory immediate postoperative appearance status post right
hip hemiarthroplasty.

## 2019-04-01 ENCOUNTER — Emergency Department
Admission: EM | Admit: 2019-04-01 | Discharge: 2019-04-01 | Disposition: A | Discharge status: Home / Self Care | Payer: Medicare Other | Attending: Emergency Medicine | Admitting: Emergency Medicine

## 2019-04-01 ENCOUNTER — Emergency Department: Payer: Medicare Other

## 2019-04-01 ENCOUNTER — Other Ambulatory Visit: Payer: Self-pay

## 2019-04-01 DIAGNOSIS — R1084 Generalized abdominal pain: Secondary | ICD-10-CM | POA: Diagnosis present

## 2019-04-01 DIAGNOSIS — I10 Essential (primary) hypertension: Secondary | ICD-10-CM | POA: Insufficient documentation

## 2019-04-01 DIAGNOSIS — I2510 Atherosclerotic heart disease of native coronary artery without angina pectoris: Secondary | ICD-10-CM | POA: Insufficient documentation

## 2019-04-01 DIAGNOSIS — E119 Type 2 diabetes mellitus without complications: Secondary | ICD-10-CM | POA: Insufficient documentation

## 2019-04-01 DIAGNOSIS — Z20828 Contact with and (suspected) exposure to other viral communicable diseases: Secondary | ICD-10-CM | POA: Diagnosis not present

## 2019-04-01 DIAGNOSIS — Z7982 Long term (current) use of aspirin: Secondary | ICD-10-CM | POA: Diagnosis not present

## 2019-04-01 DIAGNOSIS — Z79899 Other long term (current) drug therapy: Secondary | ICD-10-CM | POA: Diagnosis not present

## 2019-04-01 LAB — CBC WITH DIFFERENTIAL/PLATELET
Abs Immature Granulocytes: 0.03 10*3/uL (ref 0.00–0.07)
Basophils Absolute: 0 10*3/uL (ref 0.0–0.1)
Basophils Relative: 0 %
Eosinophils Absolute: 0 10*3/uL (ref 0.0–0.5)
Eosinophils Relative: 0 %
HCT: 40.7 % (ref 36.0–46.0)
Hemoglobin: 13.1 g/dL (ref 12.0–15.0)
Immature Granulocytes: 0 %
Lymphocytes Relative: 10 %
Lymphs Abs: 1.1 10*3/uL (ref 0.7–4.0)
MCH: 30.8 pg (ref 26.0–34.0)
MCHC: 32.2 g/dL (ref 30.0–36.0)
MCV: 95.5 fL (ref 80.0–100.0)
Monocytes Absolute: 1 10*3/uL (ref 0.1–1.0)
Monocytes Relative: 9 %
Neutro Abs: 8 10*3/uL — ABNORMAL HIGH (ref 1.7–7.7)
Neutrophils Relative %: 81 %
Platelets: 186 10*3/uL (ref 150–400)
RBC: 4.26 MIL/uL (ref 3.87–5.11)
RDW: 13 % (ref 11.5–15.5)
WBC: 10.1 10*3/uL (ref 4.0–10.5)
nRBC: 0 % (ref 0.0–0.2)

## 2019-04-01 LAB — COMPREHENSIVE METABOLIC PANEL
ALT: 12 U/L (ref 0–44)
AST: 19 U/L (ref 15–41)
Albumin: 3.5 g/dL (ref 3.5–5.0)
Alkaline Phosphatase: 85 U/L (ref 38–126)
Anion gap: 11 (ref 5–15)
BUN: 18 mg/dL (ref 8–23)
CO2: 22 mmol/L (ref 22–32)
Calcium: 8.4 mg/dL — ABNORMAL LOW (ref 8.9–10.3)
Chloride: 103 mmol/L (ref 98–111)
Creatinine, Ser: 1.11 mg/dL — ABNORMAL HIGH (ref 0.44–1.00)
GFR calc Af Amer: 51 mL/min — ABNORMAL LOW (ref >60)
GFR calc non Af Amer: 44 mL/min — ABNORMAL LOW (ref >60)
Glucose, Bld: 128 mg/dL — ABNORMAL HIGH (ref 70–99)
Potassium: 3.8 mmol/L (ref 3.5–5.1)
Sodium: 136 mmol/L (ref 135–145)
Total Bilirubin: 0.8 mg/dL (ref 0.3–1.2)
Total Protein: 7.4 g/dL (ref 6.5–8.1)

## 2019-04-01 LAB — LIPASE, BLOOD: Lipase: 21 U/L (ref 11–51)

## 2019-04-01 LAB — PROTIME-INR
INR: 1.8 — ABNORMAL HIGH (ref 0.8–1.2)
Prothrombin Time: 20.8 seconds — ABNORMAL HIGH (ref 11.4–15.2)

## 2019-04-01 LAB — APTT: aPTT: 42 seconds — ABNORMAL HIGH (ref 24–36)

## 2019-04-01 LAB — LACTIC ACID, PLASMA: Lactic Acid, Venous: 1.4 mmol/L (ref 0.5–1.9)

## 2019-04-01 LAB — SARS CORONAVIRUS 2 BY RT PCR (HOSPITAL ORDER, PERFORMED IN ~~LOC~~ HOSPITAL LAB): SARS Coronavirus 2: NEGATIVE

## 2019-04-01 MED ORDER — IOHEXOL 300 MG/ML  SOLN
75.0000 mL | Freq: Once | INTRAMUSCULAR | Status: AC | PRN
  Administered 2019-04-01: 75 mL via INTRAVENOUS

## 2019-04-01 MED ORDER — PIPERACILLIN-TAZOBACTAM 3.375 G IVPB 30 MIN
3.3750 g | Freq: Once | INTRAVENOUS | Status: AC
  Administered 2019-04-01: 21:00:00 3.375 g via INTRAVENOUS
  Filled 2019-04-01: qty 50

## 2019-04-01 NOTE — ED Provider Notes (Signed)
[facility] Emergency Department Provider Note  ____________________________________________   First MD Initiated Contact with Patient 04/01/19 1834     (approximate)  I have reviewed the triage vital signs and the nursing notes.   HISTORY  Chief Complaint Constipation    HPI Stephanie Welch No is a 85 y.o. female with diabetes, hypertension, coronary disease who [facility].  Patient not had a bowel movement in the past 24 hours and normally goes daily.  Patient was given 200 of normal saline on route.  Patient herself endorses having abdominal pain that feels like a slight discomfort in her upper abdomen that started today, nothing makes it better, nothing makes it worse.  Denies any other associated symptoms.  No fevers.       Past Medical History:  Diagnosis Date  . Coronary artery disease   . Diabetes mellitus without complication (HCC)   . Hypertension   . Macular degeneration   . Thyroid disease     Patient Active Problem List   Diagnosis Date Noted  . Femur fracture (HCC) 01/23/2016  . Hypothyroidism 01/23/2016  . HTN (hypertension) 01/23/2016  . Macular degeneration 01/23/2016    Past Surgical History:  Procedure Laterality Date  . ABDOMINAL HYSTERECTOMY    . FRACTURE SURGERY    . HIP ARTHROPLASTY Right 01/24/2016   Procedure: ARTHROPLASTY BIPOLAR HIP (HEMIARTHROPLASTY);  Surgeon: Christena FlakeJohn J Poggi, MD;  Location: [facility] ORS;  Service: Orthopedics;  Laterality: Right;    Prior to Admission medications   Medication Sig Start Date End Date Taking? Authorizing Provider  citalopram (CELEXA) 10 MG tablet Take 10 mg by mouth daily. 12/23/15   [provider]  COUMADIN 2 MG tablet Take 4-6 mg by mouth See admin instructions. Take 2 tablets (4mg ) by mouth on Monday, Tuesday, Wednesday, and Thursday. Take 3 tablets (6mg ) by mouth on Friday, Saturday, and Sunday 12/23/15   [provider]   docusate sodium (COLACE) 100 MG capsule Take 1 capsule (100 mg total) by mouth 2 (two) times daily. 01/26/16   Milagros LollSudini, Srikar, MD  enoxaparin (LOVENOX) 40 MG/0.4ML injection Inject 0.4 mLs (40 mg total) into the skin daily. 01/26/16   Milagros LollSudini, Srikar, MD  furosemide (LASIX) 20 MG tablet Take 20 mg by mouth daily. 12/23/15   [provider]  glimepiride (AMARYL) 2 MG tablet Take 2 mg by mouth every morning. 12/23/15   [provider]  levothyroxine (SYNTHROID, LEVOTHROID) 100 MCG tablet Take 100 mcg by mouth daily before breakfast. 01/13/16   [provider]  metFORMIN (GLUCOPHAGE) 500 MG tablet Take 500 mg by mouth 2 (two) times daily. 12/23/15   [provider]  metoprolol succinate (TOPROL-XL) 50 MG 24 hr tablet Take 150 mg by mouth daily with breakfast.  12/23/15   [provider]  Multiple Vitamins-Minerals (OCUVITE-LUTEIN PO) Take 1 tablet by mouth every morning.    [provider]  oxyCODONE-acetaminophen (PERCOCET) 5-325 MG tablet Take 2 tablets by mouth every 6 (six) hours as needed for moderate pain or severe pain. 01/26/16   Milagros LollSudini, Srikar, MD    Allergies Conjugated estrogens  History reviewed. No pertinent family history.  Social History Social History   Tobacco Use  . Smoking status: Never Smoker  . Smokeless tobacco: Never Used  Substance Use Topics  . Alcohol use: No  . Drug use: Not on file      Review of Systems Constitutional: No fever/chills Eyes: No visual changes. ENT:  No sore throat. Cardiovascular: Denies chest pain. Respiratory: Denies shortness of breath. Gastrointestinal: Positive for abdominal pain.  No nausea, no vomiting.  No diarrhea.  No constipation. Genitourinary: Negative for dysuria. Musculoskeletal: Negative for back pain. Skin: Negative for rash. Neurological: Negative for headaches, focal weakness or numbness. All other ROS negative ____________________________________________   PHYSICAL  EXAM:  VITAL SIGNS: ED Triage Vitals  Enc Vitals Group     BP 04/01/19 1826 (!) 156/83     Pulse Rate 04/01/19 1826 90     Resp 04/01/19 1826 18     Temp 04/01/19 1826 98.1 F (36.7 C)     Temp Source 04/01/19 1826 Oral     SpO2 04/01/19 1826 96 %     Weight 04/01/19 1827 160 lb (72.6 kg)     Height 04/01/19 1827 5\' 7"  (1.702 m)     Head Circumference --      Peak Flow --      Pain Score 04/01/19 1827 2     Pain Loc --      Pain Edu? --      Excl. in GC? --     Constitutional: Alert and oriented. Well appearing and in no acute distress. Eyes: Conjunctivae are normal. EOMI. Head: Atraumatic. Nose: No congestion/rhinnorhea. Mouth/Throat: Mucous membranes are moist.   Neck: No stridor. Trachea Midline. FROM Cardiovascular: Normal rate, regular rhythm. Grossly normal heart sounds.  Good peripheral circulation. Respiratory: Normal respiratory effort.  No retractions. Lungs CTAB. Gastrointestinal: Slightly tender in the upper abdomen without any rebound or guarding no distention. No abdominal bruits.  Musculoskeletal: No lower extremity tenderness nor edema.  No joint effusions. Neurologic:  Normal speech and language. No gross focal neurologic deficits are appreciated.  Skin:  Skin is warm, dry and intact. No rash noted. Psychiatric: Mood and affect are normal. Speech and behavior are normal. GU: Deferred   ____________________________________________   LABS (all labs ordered are listed, but only abnormal results are displayed)  Labs Reviewed  CBC WITH DIFFERENTIAL/PLATELET - Abnormal; Notable for the following components:      Result Value   Neutro Abs 8.0 (*)    All other components within normal limits  COMPREHENSIVE METABOLIC PANEL - Abnormal; Notable for the following components:   Glucose, Bld 128 (*)    Creatinine, Ser 1.11 (*)    Calcium 8.4 (*)    GFR calc non Af Amer 44 (*)    GFR calc Af Amer 51 (*)    All other components within normal limits  PROTIME-INR  - Abnormal; Notable for the following components:   Prothrombin Time 20.8 (*)    INR 1.8 (*)    All other components within normal limits  APTT - Abnormal; Notable for the following components:   aPTT 42 (*)    All other components within normal limits  SARS CORONAVIRUS 2 (HOSPITAL ORDER, PERFORMED IN May HOSPITAL LAB)  LIPASE, BLOOD  LACTIC ACID, PLASMA  URINALYSIS, ROUTINE W REFLEX MICROSCOPIC  LACTIC ACID, PLASMA   ____________________________________________  RADIOLOGY   Official radiology report(s): Ct Abdomen Pelvis W Contrast  Result Date: 04/01/2019 CLINICAL DATA:  Constipation.  Abdominal pain and swelling. EXAM: CT ABDOMEN AND PELVIS WITH CONTRAST TECHNIQUE: Multidetector CT imaging of the abdomen and pelvis was performed using the standard protocol following bolus administration of intravenous contrast. CONTRAST:  75mL OMNIPAQUE IOHEXOL 300 MG/ML  SOLN COMPARISON:  None. FINDINGS: Lower chest: The heart is enlarged. There is a right pleural effusion with right base  patchy density presumed represent atelectasis. Hepatobiliary: No significant liver parenchymal finding. No calcified gallstones. Pancreas: Normal Spleen: Normal Adrenals/Urinary Tract: Adrenal glands are normal. Kidneys are normal. Few small cysts. No mass or hydronephrosis. Bladder is normal. Stomach/Bowel: Normal appearance of the colon. No increased stool. Acute small bowel pathology in the central abdomen with wall thickening and mesenteric edema. The differential diagnosis is that of incarcerated internal hernia versus is infectious inflammatory disease purses ischemic disease. Vascular/Lymphatic: Aortic atherosclerosis. No aneurysm. IVC is normal. No retroperitoneal adenopathy. Reproductive: Previous hysterectomy.  No pelvic mass. Other: Tiny amount of free fluid in the cul-de-sac.  No free air. Musculoskeletal: Chronic spinal degenerative changes. No acute bone finding. Previous right hip replacement.  IMPRESSION: Acute small bowel pathology in the central abdomen. The differential diagnosis is incarcerated internal hernia versus infectious inflammatory disease versus ischemic disease. Mesenteric edema of would favor incarcerated hernia or ischemic disease. General surgical consultation suggested. Electronically Signed   By: Paulina FusiMark  Shogry M.D.   On: 04/01/2019 19:55    ____________________________________________   PROCEDURES  Procedure(s) performed (including Critical Care):  Procedures   ____________________________________________   INITIAL IMPRESSION / ASSESSMENT AND PLAN / ED COURSE  Stephanie Welch Dano was evaluated in Emergency Department on 04/01/2019 for the symptoms described in the history of present illness. She was evaluated in the context of the global COVID-19 pandemic, which necessitated consideration that the patient might be at risk for infection with the SARS-CoV-2 virus that causes COVID-19. Institutional protocols and algorithms that pertain to the evaluation of patients at risk for COVID-19 are in a state of rapid change based on information released by regulatory bodies including the CDC and federal and state organizations. These policies and algorithms were followed during the patient's care in the ED.    Patient is a 85 year old who presents with abdominal pain and concern that she has abdominal pain with no bowel movement today.  Given patient's age we will get CT scan to evaluate for other pathologies such as perforation, SBO, hernia. Patient is well-appearing and does not appear septic or bacteremic.  White count is normal making infection less likely.  Creatinine slightly elevated at 1.11.  INR is 1.8.  On review of patient's medication appears that she is on Lovenox.  CT scan is concerning for acute small bowel pathology with a incarcerated internal hernia versus infectious inflammatory disease versus ischemic disease.  Will add on lactate and Zosyn and consult surgery.   8:32 PM D/w Dr. Tonna BoehringerSakai surgery. They will come see patient.  9:15 PM pt seen by Dr. Tonna BoehringerSakai and felt that unlikely anything surgical going on.  Lactate was normal.  They felt comfortable with patient being admitted to hospital for reevaluation the morning versus going home with outpatient follow-up.  I had a lengthy discussion with patient about being admitted the hospital for observation versus coming back if her pain return.  Patient was adamant that she would prefer to go back to her facility.  She understands that there is a chance that her abdominal pain can get worse that she could die but she responded with she was not expecting much more at this age of 89.  She has no other family members that she wanted me to contact to discussed with they have all passed away.  Patient seems like she has capacity to make this decision.  We give patient surgeries information to schedule appointment.  Given no white count will hold off on antibiotics.  Patient understands return precautions  Reevaluated  patient's abdominal exam continues to be soft, nontender, no rebound and no guarding.  I discussed the provisional nature of ED diagnosis, the treatment so far, the ongoing plan of care, follow up appointments and return precautions with the patient and any family or support people present. They expressed understanding and agreed with the plan, discharged home.  ____________________________________________   FINAL CLINICAL IMPRESSION(S) / ED DIAGNOSES   Final diagnoses:  Generalized abdominal pain      MEDICATIONS GIVEN DURING THIS VISIT:  Medications  iohexol (OMNIPAQUE) 300 MG/ML solution 75 mL (75 mLs Intravenous Contrast Given 04/01/19 1925)  piperacillin-tazobactam (ZOSYN) IVPB 3.375 g (0 g Intravenous Stopped 04/01/19 2214)     ED Discharge Orders    None       Note:  This document was prepared using Dragon voice recognition software and may include unintentional dictation errors.    Vanessa [place], MD 04/01/19 2226

## 2019-04-01 NOTE — Consult Note (Signed)
Subjective:   CC: abdominal pain, nausea  HPI:  Stephanie Welch is a 85 y.o. female [facility]  Symptoms were first noted 3 days ago.  Initially started as nausea.  No specific instigating factor reported.  Nausea gradually got worse with some episodes of retching earlier today, possibly yesterday.  She states that the abdominal pain may have potentially started around the time of the retching.  She has been able to eat normally until yesterday.  She also complains of not being able to have a bowel movement today.  Reports she has bowel movements daily.  No issues with urination.  The pain and retching seems to have gotten worse, so patient decided to come to the ED.  Upon review of her charts she [facility] a couple days prior to the onset of nausea and was prescribed Keflex for some folliculitis.  No documented history of whether or not she is able to tolerate Keflex or has ever tried Keflex.  Past Medical History:  has a past medical history of Coronary artery disease, Diabetes mellitus without complication (HCC), Hypertension, Macular degeneration, and Thyroid disease.  Past Surgical History:  Past Surgical History:  Procedure Laterality Date  . ABDOMINAL HYSTERECTOMY    . FRACTURE SURGERY    . HIP ARTHROPLASTY Right 01/24/2016   Procedure: ARTHROPLASTY BIPOLAR HIP (HEMIARTHROPLASTY);  Surgeon: Christena FlakeJohn J Poggi, MD;  Location: [facility] ORS;  Service: Orthopedics;  Laterality: Right;    Family History: family history is not on file.  Social History:  reports that she has never smoked. She has never used smokeless tobacco. She reports that she does not drink alcohol. No history on file for drug.  Current Medications:  citalopram (CELEXA) 10 MG tablet Take 10 mg by mouth daily. [provider] Needs Review  COUMADIN 2 MG tablet Take 4-6 mg by mouth See admin instructions. Take 2 tablets (4mg ) by mouth on Monday,  Tuesday, Wednesday, and Thursday. Take 3 tablets (6mg ) by mouth on Friday, Saturday, and Sunday [provider] Needs Review            furosemide (LASIX) 20 M tablet Take 20 mg by mouth daily. [provider] Needs Review  glimepiride (AMARYL) 2 MG tablet Take 2 mg by mouth every morning. [provider] Needs Review  levothyroxine (SYNTHROID, LEVOTHROID) 100 MCG tablet Take 100 mcg by mouth daily before breakfast. [provider] Needs Review  metFORMIN (GLUCOPHAGE) 500 MG tablet Take 500 mg by mouth 2 (two) times daily. [provider] Needs Review  metoprolol succinate (TOPROL-XL) 50 MG 24 hr tablet Take 150 mg by mouth daily with breakfast.  [provider] Needs Review  Multiple Vitamins-Minerals (OCUVITE-LUTEIN PO) Take 1 tablet by mouth every morning. [provider] Needs Review  oxyCODONE-acetaminophen (PERCOCET) 5-325 MG tablet Take 2 tablets by mouth every 6 (six) hours as needed for moderate pain or severe pain. Milagros LollSudini, Srikar, MD Needs Review     Allergies:  Allergies as of 04/01/2019 - Review Complete 04/01/2019  Allergen Reaction Noted  . Conjugated estrogens  01/22/2016    ROS:  General: Denies weight loss, weight gain, fatigue, fevers, chills, and night sweats. Eyes: Denies blurry vision, double vision, eye pain, itchy eyes, and tearing. Ears: Denies hearing loss, earache, and ringing in ears. Nose: Denies sinus pain, congestion, infections, runny nose, and nosebleeds. Mouth/throat: Denies hoarseness, sore throat, bleeding gums, and difficulty swallowing. Heart: Denies  chest pain, palpitations, racing heart, irregular heartbeat, leg pain or swelling, and decreased activity tolerance. Respiratory: Denies breathing difficulty, shortness of breath, wheezing, cough, and sputum. GI: Denies heartburn, vomiting, diarrhea, and blood in stool. GU: Denies difficulty urinating, pain with urinating, urgency, frequency,  blood in urine. Musculoskeletal: Denies joint stiffness, pain, swelling, muscle weakness. Skin: Denies rash, itching, mass, tumors, sores, and boils Neurologic: Denies headache, fainting, dizziness, seizures, numbness, and tingling. Psychiatric: Denies depression, anxiety, difficulty sleeping, and memory loss. Endocrine: Denies heat or cold intolerance, and increased thirst or urination. Blood/lymph: Denies easy bruising, easy bruising, and swollen glands     Objective:     BP (!) 157/104 (BP Location: Right Arm)   Pulse (!) 103   Temp 98.1 F (36.7 C) (Oral)   Resp 16   Ht 5\' 7"  (1.702 m)   Wt 72.6 kg   SpO2 98%   BMI 25.06 kg/m   Constitutional :  alert, cooperative, appears stated age and no distress  Lymphatics/Throat:  no asymmetry, masses, or scars  Respiratory:  clear to auscultation bilaterally  Cardiovascular:  irregularly irregular rhythm  Gastrointestinal: Soft, no guarding, no rebound tenderness, but has some focal tenderness in the lateral corners of all her quadrants. .   Musculoskeletal: Steady gait and movement  Skin: Cool and moist   Psychiatric: Normal affect, non-agitated, not confused       LABS:  CMP Latest Ref Rng & Units 04/01/2019 10/01/2017 01/26/2016  Glucose 70 - 99 mg/dL 128(H) 76 108(H)  BUN 8 - 23 mg/dL 18 23(H) 28(H)  Creatinine 0.44 - 1.00 mg/dL 1.11(H) 0.93 0.92  Sodium 135 - 145 mmol/L 136 136 133(L)  Potassium 3.5 - 5.1 mmol/L 3.8 4.5 4.0  Chloride 98 - 111 mmol/L 103 102 103  CO2 22 - 32 mmol/L 22 23 23   Calcium 8.9 - 10.3 mg/dL 8.4(L) 9.3 8.2(L)  Total Protein 6.5 - 8.1 g/dL 7.4 - -  Total Bilirubin 0.3 - 1.2 mg/dL 0.8 - -  Alkaline Phos 38 - 126 U/L 85 - -  AST 15 - 41 U/L 19 - -  ALT 0 - 44 U/L 12 - -   CBC Latest Ref Rng & Units 04/01/2019 10/01/2017 01/25/2016  WBC 4.0 - 10.5 K/uL 10.1 6.3 9.2  Hemoglobin 12.0 - 15.0 g/dL 13.1 13.0 12.0  Hematocrit 36.0 - 46.0 % 40.7 39.3 36.5  Platelets 150 - 400 K/uL 186 299 141(L)     RADS: CLINICAL DATA:  Constipation.  Abdominal pain and swelling.  EXAM: CT ABDOMEN AND PELVIS WITH CONTRAST  TECHNIQUE: Multidetector CT imaging of the abdomen and pelvis was performed using the standard protocol following bolus administration of intravenous contrast.  CONTRAST:  18mL OMNIPAQUE IOHEXOL 300 MG/ML  SOLN  COMPARISON:  None.  FINDINGS: Lower chest: The heart is enlarged. There is a right pleural effusion with right base patchy density presumed represent atelectasis.  Hepatobiliary: No significant liver parenchymal finding. No calcified gallstones.  Pancreas: Normal  Spleen: Normal  Adrenals/Urinary Tract: Adrenal glands are normal. Kidneys are normal. Few small cysts. No mass or hydronephrosis. Bladder is normal.  Stomach/Bowel: Normal appearance of the colon. No increased stool. Acute small bowel pathology in the central abdomen with wall thickening and mesenteric edema. The differential diagnosis is that of incarcerated internal hernia versus is infectious inflammatory disease purses ischemic disease.  Vascular/Lymphatic: Aortic atherosclerosis. No aneurysm. IVC is normal. No retroperitoneal adenopathy.  Reproductive: Previous hysterectomy.  No pelvic mass.  Other: Tiny amount of free  fluid in the cul-de-sac.  No free air.  Musculoskeletal: Chronic spinal degenerative changes. No acute bone finding. Previous right hip replacement.  IMPRESSION: Acute small bowel pathology in the central abdomen. The differential diagnosis is incarcerated internal hernia versus infectious inflammatory disease versus ischemic disease. Mesenteric edema of would favor incarcerated hernia or ischemic disease. General surgical consultation suggested.   Electronically Signed   By: Paulina FusiMark  Shogry M.D.   On: 04/01/2019 19:55  Assessment:   Abdominal pain and nausea.  CT scan with concerns of some small bowel edema.  Etiology uncertain at this  time.  Plan:   Patient does have some abdominal pain but nonlocalized, which indicates the small bowel edema seen in the CT scan likely is not a source of her current symptoms.  Internal hernia was noted as a possibility of CT results, but I would expect that she will have more obvious obstructive symptoms as well as indications of a proximal obstruction on her CT.  She does complain of not having a bowel movement for a day but her colon is nondistended and not full of stool.  Likely not cause of her pain.  Side effect from her recent Keflex administration may be a possibility.  The abdominal pain possibly from her retching that seems to have started shortly before the pain onset.  With otherwise normal labs except for a very slightly elevated creatinine level, doubt there is any acute surgical pathology at this time.  Discussed with ED provider, and recommended observation in the hospital if the symptoms persist.  Surgery will continue to follow if she does get admitted.  She is pending a COVID test at this point.

## 2019-04-01 NOTE — ED Triage Notes (Signed)
PT to ED via EMS from the [facility] [place]. Per pt, she has not had BM in 24hrs, and normally goes daily. When EMS arrived, pt was on toilet, Ems wiped bottom and no stool had been passed. ABD is taut. PT received approx 200cc NS enroute. AO with pain 2/10.

## 2019-04-01 NOTE — Discharge Instructions (Addendum)
You had a CT scan as below but the surgery team evaluated you and did not feel like your exam was consistent with the below or your symptoms.  They recommended you calling them to make a follow-up appointment outpatient.  If the symptoms are worsening such as worsening pain, vomiting, fevers you should return immediately to the emergency room.    IMPRESSION:  Acute small bowel pathology in the central abdomen. The differential  diagnosis is incarcerated internal hernia versus infectious  inflammatory disease versus ischemic disease. Mesenteric edema of  would favor incarcerated hernia or ischemic disease. General  surgical consultation suggested.

## 2019-04-01 NOTE — ED Notes (Signed)
ED TO INPATIENT HANDOFF REPORT  ED Nurse Name and Phone #: Geraldine ContrasDee [phone]  S Name/Age/Gender Stephanie KiefJean E Welch 85 y.o. female Room/Bed: ED05HA/ED05HA  Code Status   Code Status: Prior  Home/SNF/Other Nursing Home Patient oriented to: self, place, time and situation Is this baseline? Yes   Triage Complete: Triage complete  Chief Complaint abd pain, constipation  T[facility] Note [facility] [place]. Per pt, she has not had BM in 24hrs, and normally goes daily. When EMS arrived, pt was on toilet, Ems wiped bottom and no stool had been passed. ABD is taut. PT received approx 200cc NS enroute. AO with pain 2/10.   Allergies Allergies  Allergen Reactions  . Conjugated Estrogens     Other reaction(s): Unknown    Level of Care/Admitting Diagnosis ED Disposition    None      B Medical/Surgery History Past Medical History:  Diagnosis Date  . Coronary artery disease   . Diabetes mellitus without complication (HCC)   . Hypertension   . Macular degeneration   . Thyroid disease    Past Surgical History:  Procedure Laterality Date  . ABDOMINAL HYSTERECTOMY    . FRACTURE SURGERY    . HIP ARTHROPLASTY Right 01/24/2016   Procedure: ARTHROPLASTY BIPOLAR HIP (HEMIARTHROPLASTY);  Surgeon: Christena FlakeJohn J Poggi, MD;  Location: [facility] ORS;  Service: Orthopedics;  Laterality: Right;     A IV Location/Drains/Wounds Patient Lines/Drains/Airways Status   Active Line/Drains/Airways    Name:   Placement date:   Placement time:   Site:   Days:   Peripheral IV 04/01/19 Anterior;Left Wrist   04/01/19    1804    Wrist   less than 1   Incision (Closed) 01/24/16 Leg Right   01/24/16    1104     1163          Intake/Output Last 24 hours No intake or output data in the 24 hours ending 04/01/19 2106  Labs/Imaging Results for orders placed or performed during the hospital encounter of 04/01/19 (from the past 48 hour(s))  CBC with Differential     Status: Abnormal   Collection  Time: 04/01/19  6:53 PM  Result Value Ref Range   WBC 10.1 4.0 - 10.5 K/uL   RBC 4.26 3.87 - 5.11 MIL/uL   Hemoglobin 13.1 12.0 - 15.0 g/dL   HCT 96.040.7 45.436.0 - 09.846.0 %   MCV 95.5 80.0 - 100.0 fL   MCH 30.8 26.0 - 34.0 pg   MCHC 32.2 30.0 - 36.0 g/dL   RDW 11.913.0 14.711.5 - 82.915.5 %   Platelets 186 150 - 400 K/uL   nRBC 0.0 0.0 - 0.2 %   Neutrophils Relative % 81 %   Neutro Abs 8.0 (H) 1.7 - 7.7 K/uL   Lymphocytes Relative 10 %   Lymphs Abs 1.1 0.7 - 4.0 K/uL   Monocytes Relative 9 %   Monocytes Absolute 1.0 0.1 - 1.0 K/uL   Eosinophils Relative 0 %   Eosinophils Absolute 0.0 0.0 - 0.5 K/uL   Basophils Relative 0 %   Basophils Absolute 0.0 0.0 - 0.1 K/uL   Immature Granulocytes 0 %   Abs Immature Granulocytes 0.03 0.00 - 0.07 K/uL    Comment: Performed at [facility], [address] [place], [place] [id number]  Comprehensive metabolic panel     Status: Abnormal   Collection Time: 04/01/19  6:53 PM  Result Value Ref Range   Sodium 136 135 - 145  mmol/L   Potassium 3.8 3.5 - 5.1 mmol/L   Chloride 103 98 - 111 mmol/L   CO2 22 22 - 32 mmol/L   Glucose, Bld 128 (H) 70 - 99 mg/dL   BUN 18 8 - 23 mg/dL   Creatinine, Ser 1.11 (H) 0.44 - 1.00 mg/dL   Calcium 8.4 (L) 8.9 - 10.3 mg/dL   Total Protein 7.4 6.5 - 8.1 g/dL   Albumin 3.5 3.5 - 5.0 g/dL   AST 19 15 - 41 U/L   ALT 12 0 - 44 U/L   Alkaline Phosphatase 85 38 - 126 U/L   Total Bilirubin 0.8 0.3 - 1.2 mg/dL   GFR calc non Af Amer 44 (L) >60 mL/min   GFR calc Af Amer 51 (L) >60 mL/min   Anion gap 11 5 - 15    Comment: Performed at [facility], [address] [place], [place] [address]  Lipase, blood     Status: None   Collection Time: 04/01/19  6:53 PM  Result Value Ref Range   Lipase 21 11 - 51 U/L    Comment: Performed at [facility], [address] [place], [place] [address]  Protime-INR     Status: Abnormal   Collection Time: 04/01/19  6:53 PM  Result Value Ref Range   Prothrombin Time 20.8  (H) 11.4 - 15.2 seconds   INR 1.8 (H) 0.8 - 1.2    Comment: (NOTE) INR goal varies based on device and disease states. Performed at [facility], [address] [place], [place] [address]   APTT     Status: Abnormal   Collection Time: 04/01/19  6:53 PM  Result Value Ref Range   aPTT 42 (H) 24 - 36 seconds    Comment:        IF BASELINE aPTT IS ELEVATED, SUGGEST PATIENT RISK ASSESSMENT BE USED TO DETERMINE APPROPRIATE ANTICOAGULANT THERAPY. Performed at [facility], [address] [place], [place] [address]    Ct Abdomen Pelvis W Contrast  Result Date: 04/01/2019 CLINICAL DATA:  Constipation.  Abdominal pain and swelling. EXAM: CT ABDOMEN AND PELVIS WITH CONTRAST TECHNIQUE: Multidetector CT imaging of the abdomen and pelvis was performed using the standard protocol following bolus administration of intravenous contrast. CONTRAST:  6mL OMNIPAQUE IOHEXOL 300 MG/ML  SOLN COMPARISON:  None. FINDINGS: Lower chest: The heart is enlarged. There is a right pleural effusion with right base patchy density presumed represent atelectasis. Hepatobiliary: No significant liver parenchymal finding. No calcified gallstones. Pancreas: Normal Spleen: Normal Adrenals/Urinary Tract: Adrenal glands are normal. Kidneys are normal. Few small cysts. No mass or hydronephrosis. Bladder is normal. Stomach/Bowel: Normal appearance of the colon. No increased stool. Acute small bowel pathology in the central abdomen with wall thickening and mesenteric edema. The differential diagnosis is that of incarcerated internal hernia versus is infectious inflammatory disease purses ischemic disease. Vascular/Lymphatic: Aortic atherosclerosis. No aneurysm. IVC is normal. No retroperitoneal adenopathy. Reproductive: Previous hysterectomy.  No pelvic mass. Other: Tiny amount of free fluid in the cul-de-sac.  No free air. Musculoskeletal: Chronic spinal degenerative changes. No acute bone finding. Previous right hip  replacement. IMPRESSION: Acute small bowel pathology in the central abdomen. The differential diagnosis is incarcerated internal hernia versus infectious inflammatory disease versus ischemic disease. Mesenteric edema of would favor incarcerated hernia or ischemic disease. General surgical consultation suggested. Electronically Signed   By: Nelson Chimes M.D.   On: 04/01/2019 19:55    Pending Labs Unresulted Labs (From admission, onward)  Start     Ordered   04/01/19 2032  SARS Coronavirus 2 [facility](Hospital order, Performed in [facility] hospital lab) Nasopharyngeal Nasopharyngeal Swab  (Symptomatic/High Risk of Exposure/Tier 1 Patients Labs with Precautions)  Once,   STAT    Question Answer Comment  Is this test for diagnosis or screening Diagnosis of ill patient   Symptomatic for COVID-19 as defined by CDC Yes   Date of Symptom Onset 04/01/2019   Hospitalized for COVID-19 Yes   Admitted to ICU for COVID-19 No   Previously tested for COVID-19 No   Resident in a congregate (group) care setting Yes   Employed in healthcare setting No   Pregnant No      04/01/19 2031   04/01/19 2025  Lactic acid, plasma  Now then every 2 hours,   STAT     04/01/19 2024   04/01/19 1839  Urinalysis, Routine w reflex microscopic  Once,   STAT     04/01/19 1841          Vitals/Pain Today's Vitals   04/01/19 1826 04/01/19 1827 04/01/19 1902 04/01/19 2046  BP: (!) 156/83  (!) 141/78 (!) 157/104  Pulse: 90  92 (!) 103  Resp: 18  18 16   Temp: 98.1 F (36.7 C)     TempSrc: Oral     SpO2: 96%  97% 98%  Weight:  72.6 kg    Height:  5\' 7"  (1.702 m)    PainSc:  2       Isolation Precautions No active isolations  Medications Medications  piperacillin-tazobactam (ZOSYN) IVPB 3.375 g (3.375 g Intravenous New Bag/Given 04/01/19 2053)  iohexol (OMNIPAQUE) 300 MG/ML solution 75 mL (75 mLs Intravenous Contrast Given 04/01/19 1925)    Mobility walks with device Low fall risk   Focused  Assessments    R Recommendations: See Admitting Provider Note  Report given to:

## 2019-09-08 ENCOUNTER — Other Ambulatory Visit: Payer: Self-pay

## 2019-09-08 ENCOUNTER — Inpatient Hospital Stay
Admission: EM | Admit: 2019-09-08 | Discharge: 2019-09-10 | DRG: 535 | Disposition: A | Discharge status: Skilled Nursing Facility | Payer: Medicare Other | Attending: Internal Medicine | Admitting: Internal Medicine

## 2019-09-08 ENCOUNTER — Emergency Department: Payer: Medicare Other

## 2019-09-08 DIAGNOSIS — E119 Type 2 diabetes mellitus without complications: Secondary | ICD-10-CM

## 2019-09-08 DIAGNOSIS — Z96641 Presence of right artificial hip joint: Secondary | ICD-10-CM | POA: Diagnosis not present

## 2019-09-08 DIAGNOSIS — S32592A Other specified fracture of left pubis, initial encounter for closed fracture: Secondary | ICD-10-CM

## 2019-09-08 DIAGNOSIS — S32511A Fracture of superior rim of right pubis, initial encounter for closed fracture: Secondary | ICD-10-CM | POA: Diagnosis not present

## 2019-09-08 DIAGNOSIS — G9341 Metabolic encephalopathy: Secondary | ICD-10-CM | POA: Diagnosis present

## 2019-09-08 DIAGNOSIS — I10 Essential (primary) hypertension: Secondary | ICD-10-CM | POA: Diagnosis present

## 2019-09-08 DIAGNOSIS — Z20822 Contact with and (suspected) exposure to covid-19: Secondary | ICD-10-CM | POA: Diagnosis not present

## 2019-09-08 DIAGNOSIS — Z66 Do not resuscitate: Secondary | ICD-10-CM | POA: Diagnosis present

## 2019-09-08 DIAGNOSIS — Z7984 Long term (current) use of oral hypoglycemic drugs: Secondary | ICD-10-CM

## 2019-09-08 DIAGNOSIS — S32591A Other specified fracture of right pubis, initial encounter for closed fracture: Secondary | ICD-10-CM | POA: Diagnosis present

## 2019-09-08 DIAGNOSIS — F39 Unspecified mood [affective] disorder: Secondary | ICD-10-CM | POA: Diagnosis not present

## 2019-09-08 DIAGNOSIS — W19XXXA Unspecified fall, initial encounter: Secondary | ICD-10-CM | POA: Diagnosis not present

## 2019-09-08 DIAGNOSIS — I2510 Atherosclerotic heart disease of native coronary artery without angina pectoris: Secondary | ICD-10-CM | POA: Diagnosis not present

## 2019-09-08 DIAGNOSIS — E039 Hypothyroidism, unspecified: Secondary | ICD-10-CM | POA: Diagnosis not present

## 2019-09-08 DIAGNOSIS — R4182 Altered mental status, unspecified: Secondary | ICD-10-CM | POA: Diagnosis present

## 2019-09-08 DIAGNOSIS — Z8673 Personal history of transient ischemic attack (TIA), and cerebral infarction without residual deficits: Secondary | ICD-10-CM | POA: Diagnosis not present

## 2019-09-08 DIAGNOSIS — F0390 Unspecified dementia without behavioral disturbance: Secondary | ICD-10-CM | POA: Diagnosis not present

## 2019-09-08 DIAGNOSIS — Z7901 Long term (current) use of anticoagulants: Secondary | ICD-10-CM | POA: Diagnosis not present

## 2019-09-08 DIAGNOSIS — S32512A Fracture of superior rim of left pubis, initial encounter for closed fracture: Secondary | ICD-10-CM | POA: Diagnosis present

## 2019-09-08 DIAGNOSIS — I482 Chronic atrial fibrillation, unspecified: Secondary | ICD-10-CM | POA: Diagnosis not present

## 2019-09-08 DIAGNOSIS — R404 Transient alteration of awareness: Secondary | ICD-10-CM

## 2019-09-08 DIAGNOSIS — I5020 Unspecified systolic (congestive) heart failure: Secondary | ICD-10-CM

## 2019-09-08 DIAGNOSIS — I5042 Chronic combined systolic (congestive) and diastolic (congestive) heart failure: Secondary | ICD-10-CM | POA: Diagnosis not present

## 2019-09-08 DIAGNOSIS — S3282XA Multiple fractures of pelvis without disruption of pelvic ring, initial encounter for closed fracture: Secondary | ICD-10-CM

## 2019-09-08 DIAGNOSIS — I110 Hypertensive heart disease with heart failure: Secondary | ICD-10-CM | POA: Diagnosis not present

## 2019-09-08 LAB — CBC WITH DIFFERENTIAL/PLATELET
Abs Immature Granulocytes: 0.02 10*3/uL (ref 0.00–0.07)
Basophils Absolute: 0.1 10*3/uL (ref 0.0–0.1)
Basophils Relative: 1 %
Eosinophils Absolute: 0.3 10*3/uL (ref 0.0–0.5)
Eosinophils Relative: 4 %
HCT: 43.7 % (ref 36.0–46.0)
Hemoglobin: 14.2 g/dL (ref 12.0–15.0)
Immature Granulocytes: 0 %
Lymphocytes Relative: 14 %
Lymphs Abs: 0.9 10*3/uL (ref 0.7–4.0)
MCH: 31.3 pg (ref 26.0–34.0)
MCHC: 32.5 g/dL (ref 30.0–36.0)
MCV: 96.3 fL (ref 80.0–100.0)
Monocytes Absolute: 0.9 10*3/uL (ref 0.1–1.0)
Monocytes Relative: 14 %
Neutro Abs: 4.4 10*3/uL (ref 1.7–7.7)
Neutrophils Relative %: 67 %
Platelets: 200 10*3/uL (ref 150–400)
RBC: 4.54 MIL/uL (ref 3.87–5.11)
RDW: 13.3 % (ref 11.5–15.5)
WBC: 6.5 10*3/uL (ref 4.0–10.5)
nRBC: 0 % (ref 0.0–0.2)

## 2019-09-08 LAB — URINALYSIS, COMPLETE (UACMP) WITH MICROSCOPIC
Bilirubin Urine: NEGATIVE
Glucose, UA: NEGATIVE mg/dL
Hgb urine dipstick: NEGATIVE
Ketones, ur: 5 mg/dL — AB
Leukocytes,Ua: NEGATIVE
Nitrite: NEGATIVE
Protein, ur: 30 mg/dL — AB
Specific Gravity, Urine: 1.019 (ref 1.005–1.030)
pH: 5 (ref 5.0–8.0)

## 2019-09-08 LAB — HEMOGLOBIN A1C
Hgb A1c MFr Bld: 6.3 % — ABNORMAL HIGH (ref 4.8–5.6)
Mean Plasma Glucose: 134.11 mg/dL

## 2019-09-08 LAB — GLUCOSE, CAPILLARY: Glucose-Capillary: 259 mg/dL — ABNORMAL HIGH (ref 70–99)

## 2019-09-08 LAB — COMPREHENSIVE METABOLIC PANEL
ALT: 16 U/L (ref 0–44)
AST: 28 U/L (ref 15–41)
Albumin: 3.8 g/dL (ref 3.5–5.0)
Alkaline Phosphatase: 87 U/L (ref 38–126)
Anion gap: 12 (ref 5–15)
BUN: 22 mg/dL (ref 8–23)
CO2: 22 mmol/L (ref 22–32)
Calcium: 8.8 mg/dL — ABNORMAL LOW (ref 8.9–10.3)
Chloride: 103 mmol/L (ref 98–111)
Creatinine, Ser: 0.74 mg/dL (ref 0.44–1.00)
GFR calc Af Amer: >60 mL/min (ref >60)
GFR calc non Af Amer: >60 mL/min (ref >60)
Glucose, Bld: 118 mg/dL — ABNORMAL HIGH (ref 70–99)
Potassium: 3.5 mmol/L (ref 3.5–5.1)
Sodium: 137 mmol/L (ref 135–145)
Total Bilirubin: 1 mg/dL (ref 0.3–1.2)
Total Protein: 7.4 g/dL (ref 6.5–8.1)

## 2019-09-08 LAB — CK: Total CK: 235 U/L — ABNORMAL HIGH (ref 38–234)

## 2019-09-08 LAB — TROPONIN I (HIGH SENSITIVITY): Troponin I (High Sensitivity): 18 ng/L — ABNORMAL HIGH (ref <18)

## 2019-09-08 MED ORDER — WARFARIN SODIUM 2.5 MG PO TABS: 2.5000 mg | ORAL_TABLET | ORAL | Status: DC

## 2019-09-08 MED ORDER — FUROSEMIDE 20 MG PO TABS
20.0000 mg | ORAL_TABLET | Freq: Every day | ORAL | Status: DC
  Administered 2019-09-09 – 2019-09-10 (×2): 20 mg via ORAL
  Filled 2019-09-08 (×2): qty 1

## 2019-09-08 MED ORDER — SODIUM CHLORIDE 0.9 % IV SOLN: INTRAVENOUS | Status: DC

## 2019-09-08 MED ORDER — ACETAMINOPHEN 650 MG RE SUPP: 650.0000 mg | Freq: Four times a day (QID) | RECTAL | Status: DC | PRN

## 2019-09-08 MED ORDER — INSULIN ASPART 100 UNIT/ML ~~LOC~~ SOLN
0.0000 [IU] | Freq: Three times a day (TID) | SUBCUTANEOUS | Status: DC
  Administered 2019-09-09 – 2019-09-10 (×3): 1 [IU] via SUBCUTANEOUS
  Filled 2019-09-08 (×3): qty 1

## 2019-09-08 MED ORDER — METOPROLOL SUCCINATE ER 50 MG PO TB24
150.0000 mg | ORAL_TABLET | Freq: Every day | ORAL | Status: DC
  Administered 2019-09-09 – 2019-09-10 (×2): 150 mg via ORAL
  Filled 2019-09-08 (×2): qty 3

## 2019-09-08 MED ORDER — ACETAMINOPHEN 325 MG PO TABS: 650.0000 mg | ORAL_TABLET | Freq: Four times a day (QID) | ORAL | Status: DC | PRN

## 2019-09-08 MED ORDER — LEVOTHYROXINE SODIUM 100 MCG PO TABS
100.0000 ug | ORAL_TABLET | Freq: Every day | ORAL | Status: DC
  Administered 2019-09-09 – 2019-09-10 (×2): 100 ug via ORAL
  Filled 2019-09-08 (×2): qty 1

## 2019-09-08 MED ORDER — CITALOPRAM HYDROBROMIDE 20 MG PO TABS
10.0000 mg | ORAL_TABLET | Freq: Every day | ORAL | Status: DC
  Administered 2019-09-09 – 2019-09-10 (×2): 10 mg via ORAL
  Filled 2019-09-08 (×2): qty 1

## 2019-09-08 NOTE — ED Notes (Signed)
Attempted to call report

## 2019-09-08 NOTE — ED Notes (Signed)
Patient transported to CT 

## 2019-09-08 NOTE — ED Provider Notes (Signed)
[facility] Emergency Department Provider Note       Time seen: ----------------------------------------- 12:03 PM on 09/08/2019 -----------------------------------------  Level V caveat: History/ROS limited by altered mental status  I have reviewed the triage vital signs and the nursing notes.  HISTORY   Chief Complaint No chief complaint on file.    HPI Stephanie Welch is a 86 y.o. female with a history of coronary artery disease, diabetes, hypertension, thyroid disease who presents to the ED for a fall.  Patient arrives from independent living.  Family left the patient in the chair yesterday, was found in the same chair and closed with a strong smell of UTI.  She was favoring the left hip.  She does have a history of mild cognitive impairment.  No further information is available.  Past Medical History:  Diagnosis Date  . Coronary artery disease   . Diabetes mellitus without complication (HCC)   . Hypertension   . Macular degeneration   . Thyroid disease     Patient Active Problem List   Diagnosis Date Noted  . Femur fracture (HCC) 01/23/2016  . Hypothyroidism 01/23/2016  . HTN (hypertension) 01/23/2016  . Macular degeneration 01/23/2016    Past Surgical History:  Procedure Laterality Date  . ABDOMINAL HYSTERECTOMY    . FRACTURE SURGERY    . HIP ARTHROPLASTY Right 01/24/2016   Procedure: ARTHROPLASTY BIPOLAR HIP (HEMIARTHROPLASTY);  Surgeon: Christena Flake, MD;  Location: [facility] ORS;  Service: Orthopedics;  Laterality: Right;    Allergies Conjugated estrogens  Social History Social History   Tobacco Use  . Smoking status: Never Smoker  . Smokeless tobacco: Never Used  Substance Use Topics  . Alcohol use: No  . Drug use: Not on file    Review of Systems Unknown, reported weakness  All systems negative/normal/unremarkable except as stated in the HPI  ____________________________________________   PHYSICAL EXAM:  VITAL SIGNS: ED  Triage Vitals [09/08/19 1152]  Enc Vitals Group     BP (!) 167/93     Pulse Rate 80     Resp 17     Temp 97.8 F (36.6 C)     Temp Source Oral     SpO2 97 %     Weight 187 lb 6.3 oz (85 kg)     Height 5\' 7"  (1.702 m)     Head Circumference      Peak Flow      Pain Score 0     Pain Loc      Pain Edu?      Excl. in GC?     Constitutional: Lethargic but arouses easily to verbal stimuli.  No obvious distress ENT      Head: Normocephalic and atraumatic.      Nose: No congestion/rhinnorhea.      Mouth/Throat: Mucous membranes are moist.      Neck: No stridor. Cardiovascular: Normal rate, regular rhythm. No murmurs, rubs, or gallops. Respiratory: Normal respiratory effort without tachypnea nor retractions. Breath sounds are clear and equal bilaterally. No wheezes/rales/rhonchi. Gastrointestinal: Soft and nontender. Normal bowel sounds Musculoskeletal: Limited but unremarkable range of motion of the extremities Neurologic:  Normal speech and language. No gross focal neurologic deficits are appreciated.  Skin:  Skin is warm, dry and intact. No rash noted. Psychiatric: Mood and affect are normal. Speech and behavior are normal.   ____________________________________________  ED COURSE:  As part of my medical decision making, I reviewed the following data within the electronic MEDICAL RECORD NUMBER History  obtained from family if available, nursing notes, old chart and ekg, as well as notes from prior ED visits. Patient presented for altered mental status and recent fall, we will assess with labs and imaging as indicated at this time.   Procedures  SAWSAN RIGGIO was evaluated in Emergency Department on 09/08/2019 for the symptoms described in the history of present illness. She was evaluated in the context of the global COVID-19 pandemic, which necessitated consideration that the patient might be at risk for infection with the SARS-CoV-2 virus that causes COVID-19. Institutional protocols  and algorithms that pertain to the evaluation of patients at risk for COVID-19 are in a state of rapid change based on information released by regulatory bodies including the CDC and federal and state organizations. These policies and algorithms were followed during the patient's care in the ED.  ____________________________________________   LABS (pertinent positives/negatives)  Labs Reviewed  COMPREHENSIVE METABOLIC PANEL - Abnormal; Notable for the following components:      Result Value   Glucose, Bld 118 (*)    Calcium 8.8 (*)    All other components within normal limits  URINALYSIS, COMPLETE (UACMP) WITH MICROSCOPIC - Abnormal; Notable for the following components:   Color, Urine YELLOW (*)    APPearance HAZY (*)    Ketones, ur 5 (*)    Protein, ur 30 (*)    Bacteria, UA RARE (*)    All other components within normal limits  CK - Abnormal; Notable for the following components:   Total CK 235 (*)    All other components within normal limits  TROPONIN I (HIGH SENSITIVITY) - Abnormal; Notable for the following components:   Troponin I (High Sensitivity) 18 (*)    All other components within normal limits  CBC WITH DIFFERENTIAL/PLATELET  CBG MONITORING, ED    RADIOLOGY Images were viewed by me  CT head, chest x-ray, pelvis IMPRESSION: Mild diffuse cortical atrophy. Mild chronic ischemic white matter disease. Old left occipital infarction. No acute intracranial abnormality seen. IMPRESSION: Chronic cardiomegaly and aortic atherosclerosis.  Chronic prominent interstitial lung markings without evidence of superimposed acute finding. IMPRESSION: Acute left superior and inferior pubic rami fractures. No other acute abnormality. ____________________________________________   DIFFERENTIAL DIAGNOSIS   CVA, TIA, fracture, dehydration, electrolyte abnormality, occult infection  FINAL ASSESSMENT AND PLAN  Altered mental status, recent fall, pelvic fractures   Plan: The  patient had presented for altered mental status and recent fall. Patient's labs did reveal slight elevations in CK and troponin likely demand related. Patient's imaging revealed acute left superior and inferior pubic ramus fractures.  CT head and chest x-ray have not revealed any acute abnormality.  Patient cannot ambulate, will require admission for pain control and physical therapy.   Laurence Aly, MD    Note: This note was generated in part or whole with voice recognition software. Voice recognition is usually quite accurate but there are transcription errors that can and very often do occur. I apologize for any typographical errors that were not detected and corrected.     Earleen Newport, MD 09/08/19 [phone]

## 2019-09-08 NOTE — H&P (Signed)
History and Physical:    Stephanie Welch   BTY:[id number] DOB: 06-14-1927 DOA: 09/08/2019  Referring MD/provider: Dr. Mayford Knife PCP: [facility]   Patient coming from: Home  Chief Complaint: Altered mental status and hip fracture.   History of Present Illness:   Stephanie Welch is an 86 y.o. female with past medical history significant for hypertension, diabetes, mild dementia, macular degeneration with previous history of hip fracture who is sent to assisted living facility for altered mental status.    History as per ED physician and son from the patient. Attempt to call [facility] friend Burna Sis unsuccessful, no answer.   Patient apparently fell 3 days ago.  She was not seen but did have some hip pain.  Of note patient is on Coumadin.  She apparently was doing okay with family visiting her.  Per ED physician, patient's family visited her yesterday and when they left she was sitting in a chair apparently comfortable.  This morning when they went to see her she was apparently still in the chair and had urinated on herself and was confused.  This is very unusual for the patient so they brought her in.  Patient herself does not recall much of this.  She said she does remember waking up in the chair but thinks that she sleeps in a chair not infrequently.  She did not remember having urinated on herself.  She is surprised that anybody thought she was confused as she does not feel confused.  She did not really remember falling but does note that her left hip hurts.  [facility] is not entirely sure of the date but does know who she is. Patient does admit that she has memory problems.  She states she has no family, has never been married and has no children.  She states that she has a very good friend but cannot remember her name.  When I asked her if her name would be in the chart she said "oh yes I think it would."  ED Course:  The patient was  evaluated in the ED and noted to have essentially normal vital signs.  She was satting 97% on room air.  She was confused.  Laboratory work-up was unremarkable.  Chest x-ray and head CT were negative.  UA was negative.  Pelvic film shows superior and inferior rami fractures.  Patient is now admitted for physical therapy for pubic rami fractures and possibly for episode of altered mental status.  ROS:   ROS   Review of Systems: Patient unable to provide accurately given very poor memory.   Past Medical History:   Past Medical History:  Diagnosis Date  . Coronary artery disease   . Diabetes mellitus without complication (HCC)   . Hypertension   . Macular degeneration   . Thyroid disease     Past Surgical History:   Past Surgical History:  Procedure Laterality Date  . ABDOMINAL HYSTERECTOMY    . FRACTURE SURGERY    . HIP ARTHROPLASTY Right 01/24/2016   Procedure: ARTHROPLASTY BIPOLAR HIP (HEMIARTHROPLASTY);  Surgeon: Christena Flake, MD;  Location: [facility] ORS;  Service: Orthopedics;  Laterality: Right;    Social History:   Social History   Socioeconomic History  . Marital status: Single    Spouse name: Not on file  . Number of children: Not on file  . Years of education: Not on file  . Highest education level: Not on file  Occupational History  . Not on file  Tobacco Use  . Smoking status: Never Smoker  . Smokeless tobacco: Never Used  Substance and Sexual Activity  . Alcohol use: No  . Drug use: Not on file  . Sexual activity: Not on file  Other Topics Concern  . Not on file  Social History Narrative  . Not on file   Social Determinants of Health   Financial Resource Strain:   . Difficulty of Paying Living Expenses: Not on file  Food Insecurity:   . Worried About Charity fundraiser in the Last Year: Not on file  . Ran Out of Food in the Last Year: Not on file  Transportation Needs:   . Lack of Transportation (Medical): Not on file  . Lack of Transportation  (Non-Medical): Not on file  Physical Activity:   . Days of Exercise per Week: Not on file  . Minutes of Exercise per Session: Not on file  Stress:   . Feeling of Stress : Not on file  Social Connections:   . Frequency of Communication with Friends and Family: Not on file  . Frequency of Social Gatherings with Friends and Family: Not on file  . Attends Religious Services: Not on file  . Active Member of Clubs or Organizations: Not on file  . Attends Archivist Meetings: Not on file  . Marital Status: Not on file  Intimate Partner Violence:   . Fear of Current or Ex-Partner: Not on file  . Emotionally Abused: Not on file  . Physically Abused: Not on file  . Sexually Abused: Not on file    Allergies   Conjugated estrogens  Family history:   No family history on file.  Current Medications:   Prior to Admission medications   Medication Sig Start Date End Date Taking? Authorizing Provider  citalopram (CELEXA) 10 MG tablet Take 10 mg by mouth daily. 12/23/15   [provider]  COUMADIN 2 MG tablet Take 4-6 mg by mouth See admin instructions. Take 2 tablets (4mg ) by mouth on Monday, Tuesday, Wednesday, and Thursday. Take 3 tablets (6mg ) by mouth on Friday, Saturday, and Sunday 12/23/15   [provider]  docusate sodium (COLACE) 100 MG capsule Take 1 capsule (100 mg total) by mouth 2 (two) times daily. 01/26/16   Hillary Bow, MD  enoxaparin (LOVENOX) 40 MG/0.4ML injection Inject 0.4 mLs (40 mg total) into the skin daily. 01/26/16   Hillary Bow, MD  furosemide (LASIX) 20 MG tablet Take 20 mg by mouth daily. 12/23/15   [provider]  glimepiride (AMARYL) 2 MG tablet Take 2 mg by mouth every morning. 12/23/15   [provider]  levothyroxine (SYNTHROID, LEVOTHROID) 100 MCG tablet Take 100 mcg by mouth daily before breakfast. 01/13/16   [provider]  metFORMIN (GLUCOPHAGE) 500 MG tablet Take 500 mg by mouth 2 (two) times daily.  12/23/15   [provider]  metoprolol succinate (TOPROL-XL) 50 MG 24 hr tablet Take 150 mg by mouth daily with breakfast.  12/23/15   [provider]  Multiple Vitamins-Minerals (OCUVITE-LUTEIN PO) Take 1 tablet by mouth every morning.    [provider]  oxyCODONE-acetaminophen (PERCOCET) 5-325 MG tablet Take 2 tablets by mouth every 6 (six) hours as needed for moderate pain or severe pain. 01/26/16   Hillary Bow, MD    Physical Exam:   Vitals:   09/08/19 1152 09/08/19 1508  BP: (!) 167/93 (!) 173/116  Pulse: 80 89  Resp:  17 (!) 21  Temp: 97.8 F (36.6 C)   TempSrc: Oral   SpO2: 97% 99%  Weight: 85 kg   Height: 5\' 7"  (1.702 m)      Physical Exam: Blood pressure (!) 173/116, pulse 89, temperature 97.8 F (36.6 C), temperature source Oral, resp. rate (!) 21, height 5\' 7"  (1.702 m), weight 85 kg, SpO2 99 %. Gen: Remarkably well-appearing, spry, friendly and talkative female lying flat in bed in no acute distress.  She did admit to some pain when moving her left leg however. Constitutional: Alert and awake, oriented x3, not in any acute distress. Eyes: sclera anicteric, conjuctiva clear, no lid lag, no exophthalmos, EOMI CVS: S1-S2 clear, no murmur rubs or gallops, no LE edema, normal pedal pulses  Respiratory:  normal effort, symmetrical excursion, CTA without adventitious sounds.  GI: NABS, soft, NT, ND, no palpable masses.  LE: She does have tenderness over her left hip.  However she is able to move both of her feet and toes without difficulty. Neuro: A/O x 1, fully conversant with no speech abnormalities whatsoever, moving all extremities except for left lower extremity with normal strength, CN 3-12 intact, grossly nonfocal.  Psych: patient clearly has dementia but is coherent and charming in conversation. mood and affect appropriate to situation.    Data Review:    Labs: Basic Metabolic Panel: Recent Labs  Lab 09/08/19 1201  NA 137  K 3.5    CL 103  CO2 22  GLUCOSE 118*  BUN 22  CREATININE 0.74  CALCIUM 8.8*   Liver Function Tests: Recent Labs  Lab 09/08/19 1201  AST 28  ALT 16  ALKPHOS 87  BILITOT 1.0  PROT 7.4  ALBUMIN 3.8   No results for input(s): LIPASE, AMYLASE in the last 168 hours. No results for input(s): AMMONIA in the last 168 hours. CBC: Recent Labs  Lab 09/08/19 1201  WBC 6.5  NEUTROABS 4.4  HGB 14.2  HCT 43.7  MCV 96.3  PLT 200   Cardiac Enzymes: Recent Labs  Lab 09/08/19 1201  CKTOTAL 235*    BNP (last 3 results) No results for input(s): PROBNP in the last 8760 hours. CBG: No results for input(s): GLUCAP in the last 168 hours.  Urinalysis    Component Value Date/Time   COLORURINE YELLOW (A) 09/08/2019 1201   APPEARANCEUR HAZY (A) 09/08/2019 1201   LABSPEC 1.019 09/08/2019 1201   PHURINE 5.0 09/08/2019 1201   GLUCOSEU NEGATIVE 09/08/2019 1201   HGBUR NEGATIVE 09/08/2019 1201   BILIRUBINUR NEGATIVE 09/08/2019 1201   KETONESUR 5 (A) 09/08/2019 1201   PROTEINUR 30 (A) 09/08/2019 1201   NITRITE NEGATIVE 09/08/2019 1201   LEUKOCYTESUR NEGATIVE 09/08/2019 1201      Radiographic Studies: DG Chest 1 View  Result Date: 09/08/2019 CLINICAL DATA:  Fall with weakness. EXAM: CHEST  1 VIEW COMPARISON:  01/22/2016 FINDINGS: Chronic cardiomegaly and aortic atherosclerosis. Chronic prominent interstitial lung markings. No sign of infiltrate, mass, effusion or collapse. No significant bone finding. IMPRESSION: Chronic cardiomegaly and aortic atherosclerosis. Chronic prominent interstitial lung markings without evidence of superimposed acute finding. Electronically Signed   By: 11/06/2019 M.D.   On: 09/08/2019 12:43   DG Pelvis 1-2 Views  Result Date: 09/08/2019 CLINICAL DATA:  Patient status post fall 09/04/2018. Left hip pain. Initial encounter. EXAM: PELVIS - 1-2 VIEW COMPARISON:  CT abdomen and pelvis 04/01/2019 FINDINGS: The patient has acute fractures of the left superior and  inferior pubic rami. No hip fracture is identified. The  hips are located. Right hip replacement noted. IMPRESSION: Acute left superior and inferior pubic rami fractures. No other acute abnormality. Electronically Signed   By: Drusilla Kanner M.D.   On: 09/08/2019 12:43   CT Head Wo Contrast  Result Date: 09/08/2019 CLINICAL DATA:  Altered mental status. EXAM: CT HEAD WITHOUT CONTRAST TECHNIQUE: Contiguous axial images were obtained from the base of the skull through the vertex without intravenous contrast. COMPARISON:  October 20, 2015. FINDINGS: Brain: Mild diffuse cortical atrophy is noted. Mild chronic ischemic white matter disease is noted. Old left occipital infarction is noted. No mass effect or midline shift is noted. Ventricular size is within normal limits. There is no evidence of mass lesion, hemorrhage or acute infarction. Vascular: No hyperdense vessel or unexpected calcification. Skull: Normal. Negative for fracture or focal lesion. Sinuses/Orbits: No acute finding. Other: None. IMPRESSION: Mild diffuse cortical atrophy. Mild chronic ischemic white matter disease. Old left occipital infarction. No acute intracranial abnormality seen. Electronically Signed   By: Lupita Raider M.D.   On: 09/08/2019 12:23    EKG: Has not yet been done despite there being a upload on results tab.  There is no tracing.  Will request another one.   Assessment/Plan:   Principal Problem:   Bilateral pubic rami fractures, closed, initial encounter (HCC) Active Problems:   HTN (hypertension)   Diabetes mellitus without complication (HCC)   Altered mental status  Altered Mental Status I am not at all sure whether patient has altered mental status.  She certainly seems clear to me she is charming and coherent and does not seem in any way delirious.  She clearly has dementia with memory loss.  We do not have a good understanding of the events that led her to come to the emergency room.  I am unable to  contact patient's friend Dianne Dun by phone. I have also do not know how to contact anybody from her skilled nursing facility.  Her laboratory data, urine and chest x-ray are all negative.  I suspect patient is at her baseline.  Pubic Rami Fractures Physical therapy evaluation and treatment requested  HTN Continue lasix and Toprol XL 150 per home doses  DM2 Hold glimeperide and metformin SSI ACHS  Atrial fibrillation On metoprolol for rate control Continue Coumadin, H/H are stable Will request repeat EKG.  HFrEF No evidence for acute decompensation  Hypothyroidism Continue Synthroid     Other information:   DVT prophylaxis: On Coumadin Code Status: DNR Family Communication: No family to call Disposition Plan: SNF Consults called: None Admission status: Observation  Kristain Filo Tublu Lillard [place] [facility]  If 7PM-7AM, please contact night-coverage [email or web address] Password [facility] 09/08/2019, 4:49 PM

## 2019-09-08 NOTE — ED Notes (Signed)
Provided pt with warm blanket. Checked pt's BP on rt then left arm.

## 2019-09-08 NOTE — ED Notes (Signed)
Change pt and put clean bedding down. Pt is back in 5 hall and is resting.

## 2019-09-08 NOTE — ED Triage Notes (Signed)
From independent living, fell Friday (was not seen at that time).  Family left patient in the chair yesterday, was found today in the same chair and clothes with strong smell of UTI.  Leaning to the right, favoring left hip.  Hx of mild cognitive impairment.  Unable to bear weight today  160/90, 80, 28, 98% room air. Afebrile

## 2019-09-09 ENCOUNTER — Encounter
Admission: RE | Admit: 2019-09-09 | Discharge: 2019-09-09 | Disposition: A | Discharge status: Home / Self Care | Payer: Medicare PPO | Source: Ambulatory Visit | Attending: Internal Medicine | Admitting: Internal Medicine

## 2019-09-09 DIAGNOSIS — Z8673 Personal history of transient ischemic attack (TIA), and cerebral infarction without residual deficits: Secondary | ICD-10-CM | POA: Diagnosis not present

## 2019-09-09 DIAGNOSIS — S32511A Fracture of superior rim of right pubis, initial encounter for closed fracture: Secondary | ICD-10-CM | POA: Diagnosis not present

## 2019-09-09 DIAGNOSIS — G9341 Metabolic encephalopathy: Secondary | ICD-10-CM | POA: Diagnosis not present

## 2019-09-09 DIAGNOSIS — I110 Hypertensive heart disease with heart failure: Secondary | ICD-10-CM | POA: Diagnosis not present

## 2019-09-09 DIAGNOSIS — Z96641 Presence of right artificial hip joint: Secondary | ICD-10-CM | POA: Diagnosis not present

## 2019-09-09 DIAGNOSIS — F0390 Unspecified dementia without behavioral disturbance: Secondary | ICD-10-CM | POA: Diagnosis not present

## 2019-09-09 DIAGNOSIS — R4182 Altered mental status, unspecified: Secondary | ICD-10-CM | POA: Diagnosis present

## 2019-09-09 DIAGNOSIS — Z66 Do not resuscitate: Secondary | ICD-10-CM | POA: Diagnosis not present

## 2019-09-09 DIAGNOSIS — S32591A Other specified fracture of right pubis, initial encounter for closed fracture: Secondary | ICD-10-CM | POA: Diagnosis not present

## 2019-09-09 DIAGNOSIS — I5042 Chronic combined systolic (congestive) and diastolic (congestive) heart failure: Secondary | ICD-10-CM | POA: Diagnosis not present

## 2019-09-09 DIAGNOSIS — R404 Transient alteration of awareness: Secondary | ICD-10-CM | POA: Diagnosis not present

## 2019-09-09 DIAGNOSIS — Z7901 Long term (current) use of anticoagulants: Secondary | ICD-10-CM | POA: Diagnosis not present

## 2019-09-09 DIAGNOSIS — I2510 Atherosclerotic heart disease of native coronary artery without angina pectoris: Secondary | ICD-10-CM | POA: Diagnosis not present

## 2019-09-09 DIAGNOSIS — F39 Unspecified mood [affective] disorder: Secondary | ICD-10-CM | POA: Diagnosis not present

## 2019-09-09 DIAGNOSIS — S32512A Fracture of superior rim of left pubis, initial encounter for closed fracture: Secondary | ICD-10-CM | POA: Diagnosis not present

## 2019-09-09 DIAGNOSIS — I482 Chronic atrial fibrillation, unspecified: Secondary | ICD-10-CM | POA: Diagnosis not present

## 2019-09-09 DIAGNOSIS — E119 Type 2 diabetes mellitus without complications: Secondary | ICD-10-CM | POA: Diagnosis not present

## 2019-09-09 DIAGNOSIS — E039 Hypothyroidism, unspecified: Secondary | ICD-10-CM | POA: Diagnosis not present

## 2019-09-09 DIAGNOSIS — Z7984 Long term (current) use of oral hypoglycemic drugs: Secondary | ICD-10-CM | POA: Diagnosis not present

## 2019-09-09 DIAGNOSIS — Z20822 Contact with and (suspected) exposure to covid-19: Secondary | ICD-10-CM | POA: Diagnosis not present

## 2019-09-09 DIAGNOSIS — W19XXXA Unspecified fall, initial encounter: Secondary | ICD-10-CM | POA: Diagnosis not present

## 2019-09-09 LAB — GLUCOSE, CAPILLARY
Glucose-Capillary: 144 mg/dL — ABNORMAL HIGH (ref 70–99)
Glucose-Capillary: 162 mg/dL — ABNORMAL HIGH (ref 70–99)
Glucose-Capillary: 177 mg/dL — ABNORMAL HIGH (ref 70–99)
Glucose-Capillary: 190 mg/dL — ABNORMAL HIGH (ref 70–99)

## 2019-09-09 LAB — PROTIME-INR
INR: 2 — ABNORMAL HIGH (ref 0.8–1.2)
Prothrombin Time: 23 seconds — ABNORMAL HIGH (ref 11.4–15.2)

## 2019-09-09 LAB — BASIC METABOLIC PANEL
Anion gap: 8 (ref 5–15)
BUN: 21 mg/dL (ref 8–23)
CO2: 24 mmol/L (ref 22–32)
Calcium: 8.3 mg/dL — ABNORMAL LOW (ref 8.9–10.3)
Chloride: 105 mmol/L (ref 98–111)
Creatinine, Ser: 0.76 mg/dL (ref 0.44–1.00)
GFR calc Af Amer: >60 mL/min (ref >60)
GFR calc non Af Amer: >60 mL/min (ref >60)
Glucose, Bld: 145 mg/dL — ABNORMAL HIGH (ref 70–99)
Potassium: 3.4 mmol/L — ABNORMAL LOW (ref 3.5–5.1)
Sodium: 137 mmol/L (ref 135–145)

## 2019-09-09 LAB — CBC
HCT: 39.3 % (ref 36.0–46.0)
Hemoglobin: 12.9 g/dL (ref 12.0–15.0)
MCH: 31.3 pg (ref 26.0–34.0)
MCHC: 32.8 g/dL (ref 30.0–36.0)
MCV: 95.4 fL (ref 80.0–100.0)
Platelets: 194 10*3/uL (ref 150–400)
RBC: 4.12 MIL/uL (ref 3.87–5.11)
RDW: 13.2 % (ref 11.5–15.5)
WBC: 8.4 10*3/uL (ref 4.0–10.5)
nRBC: 0 % (ref 0.0–0.2)

## 2019-09-09 LAB — SARS CORONAVIRUS 2 (TAT 6-24 HRS): SARS Coronavirus 2: NEGATIVE

## 2019-09-09 MED ORDER — WARFARIN SODIUM 2.5 MG PO TABS
2.5000 mg | ORAL_TABLET | ORAL | Status: DC
  Administered 2019-09-09: 2.5 mg via ORAL
  Filled 2019-09-09: qty 1

## 2019-09-09 MED ORDER — POTASSIUM CHLORIDE CRYS ER 20 MEQ PO TBCR
20.0000 meq | EXTENDED_RELEASE_TABLET | Freq: Once | ORAL | Status: AC
  Administered 2019-09-09: 20 meq via ORAL
  Filled 2019-09-09: qty 1

## 2019-09-09 MED ORDER — WARFARIN SODIUM 5 MG PO TABS
5.0000 mg | ORAL_TABLET | ORAL | Status: DC
  Filled 2019-09-09: qty 1

## 2019-09-09 NOTE — NC FL2 (Signed)
[place] MEDICAID FL2 LEVEL OF CARE SCREENING TOOL     IDENTIFICATION  Patient Name: Stephanie Welch Birthdate: 19-Aug-1927 Sex: female Admission Date (Current Location): 09/08/2019  [place] and [place] Number:  Chiropodist and Address:  [facility], [address], [place], [place] [address]      Provider Number: [id number]  Attending Physician Name and Address:  Alberteen Sam, *  Relative Name and Phone Number:       Current Level of Care: Hospital Recommended Level of Care: Skilled Nursing Facility Prior Approval Number:    Date Approved/Denied:   PASRR Number: [id number]  Discharge Plan: SNF    Current Diagnoses: Patient Active Problem List   Diagnosis Date Noted  . Closed bilateral fracture of pubic rami (HCC) 09/08/2019  . Diabetes mellitus without complication (HCC)   . Altered mental status   . Bilateral pubic rami fractures, closed, initial encounter (HCC)   . Atrial fibrillation, chronic (HCC)   . HFrEF (heart failure with reduced ejection fraction) (HCC)   . Femur fracture (HCC) 01/23/2016  . Hypothyroidism 01/23/2016  . HTN (hypertension) 01/23/2016  . Macular degeneration 01/23/2016    Orientation RESPIRATION BLADDER Height & Weight     Self, Time, Situation, Place  Normal   Weight: 166 lb 8 oz (75.5 kg) Height:  5\' 7"  (170.2 cm)  BEHAVIORAL SYMPTOMS/MOOD NEUROLOGICAL BOWEL NUTRITION STATUS  (None) (None) Continent Diet(Heart healthy/carb modified.)  AMBULATORY STATUS COMMUNICATION OF NEEDS Skin   Limited Assist Verbally Other (Comment)(Skin tear. Leg scabbed.)                       Personal Care Assistance Level of Assistance              Functional Limitations Info  Sight, Hearing, Speech Sight Info: Adequate Hearing Info: Adequate Speech Info: Adequate    SPECIAL CARE FACTORS FREQUENCY  PT (By licensed PT)     PT Frequency: 5 x week              Contractures Contractures  Info: Not present    Additional Factors Info  Code Status, Allergies Code Status Info: DNR Allergies Info: Conjugated Estrogens           Current Medications (09/09/2019):  This is the current hospital active medication list Current Facility-Administered Medications  Medication Dose Route Frequency Provider Last Rate Last Admin  . acetaminophen (TYLENOL) tablet 650 mg  650 mg Oral Q6H PRN 11/07/2019, MD       Or  . acetaminophen (TYLENOL) suppository 650 mg  650 mg Rectal Q6H PRN Pieter Partridge Tublu, MD      . citalopram (CELEXA) tablet 10 mg  10 mg Oral Daily Leandro Reasoner Tublu, MD   10 mg at 09/09/19 1021  . furosemide (LASIX) tablet 20 mg  20 mg Oral Daily 11/07/19 Tublu, MD   20 mg at 09/09/19 1022  . insulin aspart (novoLOG) injection 0-6 Units  0-6 Units Subcutaneous TID WC 11/07/19, MD   1 Units at 09/09/19 1345  . levothyroxine (SYNTHROID) tablet 100 mcg  100 mcg Oral QAC breakfast 11/07/19 Tublu, MD   100 mcg at 09/09/19 1022  . metoprolol succinate (TOPROL-XL) 24 hr tablet 150 mg  150 mg Oral Q breakfast 11/07/19 Tublu, MD   150 mg at 09/09/19 1023  . warfarin (COUMADIN) tablet 2.5 mg  2.5 mg Oral Once per day on Mon Tue Chatterjee,  Kyra Searles, MD       And  . Derrill Memo ON 09/10/2019] warfarin (COUMADIN) tablet 5 mg  5 mg Oral Once per day on Sun Wed Thu Fri Sat Vashti Hey, MD         Discharge Medications: Please see discharge summary for a list of discharge medications.  Relevant Imaging Results:  Relevant Lab Results:   Additional Information SS#:  Candie Chroman, LCSW

## 2019-09-09 NOTE — Clinical Social Work Note (Signed)
Left voicemail for [facility] admissions coordinator to see if patient was in the ILF/ALF prior to admission.  Charlynn Court, CSW [phone]

## 2019-09-09 NOTE — TOC Initial Note (Signed)
Transition of Care [facility]) - Initial/Assessment Note    Patient Details  Name: Stephanie Welch MRN: [id number] Date of Birth: [birth year]  [facility]) CM/SW Contact:    Candie Chroman, LCSW Phone Number: 09/09/2019, 3:15 PM  Clinical Narrative: CSW received call back from [place]. Patient was living in their [place] side with a plan to transition to their ALF yesterday. Patient fell in the parking lot looking for her car so she could go to work. Joelene Millin agreeable to SNF placement at their rehab [facility]) before going to the ALF side. They can take her tomorrow. Patient will not need insurance authorization or a new COVID test. Patient has 14 free days but will have to pay for supplies. Therapy and medications will be billed through her part B. Patient and her friend Charlett Nose are aware and agreeable. Charlett Nose is her only contact person. No family. No further concerns. CSW encouraged patient and her friend to contact CSW as needed. CSW will continue to follow patient for and her friend for support and facilitate discharge to SNF tomorrow.                Expected Discharge Plan: Skilled Nursing Facility Barriers to Discharge: Continued Medical Work up   Patient Goals and CMS Choice     Choice offered to / list presented to : NA  Expected Discharge Plan and Services Expected Discharge Plan: [place] Choice: [place] Living arrangements for the past 2 months: [place]                                      Prior Living Arrangements/Services Living arrangements for the past 2 months: [facility] Lives with:: Self Patient language and need for interpreter reviewed:: Yes Do you feel safe going back to the place where you live?: Yes      Need for Family Participation in Patient Care: Yes (Comment) Care giver support system in place?: Yes (comment)    Criminal Activity/Legal Involvement Pertinent to Current Situation/Hospitalization: No - Comment as needed  Activities of Daily Living Home Assistive Devices/Equipment: Eyeglasses, Cane (specify quad or straight), Walker (specify type) ADL Screening (condition at time of admission) Patient's cognitive ability adequate to safely complete daily activities?: No Is the patient deaf or have difficulty hearing?: Yes Does the patient have difficulty seeing, even when wearing glasses/contacts?: Yes Does the patient have difficulty concentrating, remembering, or making decisions?: Yes Patient able to express need for assistance with ADLs?: Yes Does the patient have difficulty dressing or bathing?: Yes Independently performs ADLs?: Yes (appropriate for developmental age) Does the patient have difficulty walking or climbing stairs?: Yes Weakness of Legs: Both Weakness of Arms/Hands: Both  Permission Sought/Granted Permission sought to share information with : Customer service manager, Other (comment)(Friend) Permission granted to share information with : Yes, Verbal Permission Granted  Share Information with NAME: Angelia Mould  Permission granted to share info w AGENCY: [place] granted to share info w Relationship: Friend  Permission granted to share info w Contact Information: [phone]  Emotional Assessment Appearance:: Appears stated age Attitude/Demeanor/Rapport: Engaged, Gracious Affect (typically observed): Accepting, Appropriate, Calm, Pleasant Orientation: : Oriented to Self, Oriented to Place, Oriented to  Time, Oriented to Situation Alcohol / Substance Use: Not Applicable Psych Involvement: No (comment)  Admission diagnosis:  Fall, initial encounter [W19.XXXA] Multiple closed fractures of pelvis without disruption of pelvic ring, initial encounter (HCC) [S32.82XA] Closed bilateral fracture of pubic rami (HCC) [S32.591A,  S32.592A] Altered mental status, unspecified altered mental status type [R41.82] Patient Active Problem List   Diagnosis Date Noted  . Closed bilateral fracture of pubic rami (HCC) 09/08/2019  . Diabetes mellitus without complication (HCC)   . Altered mental status   . Bilateral pubic rami fractures, closed, initial encounter (HCC)   . Atrial fibrillation, chronic (HCC)   . HFrEF (heart failure with reduced ejection fraction) (HCC)   . Femur fracture (HCC) 01/23/2016  . Hypothyroidism 01/23/2016  . HTN (hypertension) 01/23/2016  . Macular degeneration 01/23/2016   PCP:  [facility]:   [address] [place], [place] - [address] [place] [address] Phone: [phone] Fax: [phone]  [facility] - [place], [place] - [address] [place] [address] Phone: [phone] Fax: [phone]     Social Determinants of Health (SDOH) Interventions    Readmission Risk Interventions No flowsheet data found.

## 2019-09-09 NOTE — Plan of Care (Signed)
  Problem: Pain Management: Goal: Pain level will decrease Outcome: Progressing   Problem: Skin Integrity: Goal: Risk for impaired skin integrity will decrease Outcome: Progressing  Patient complains of pain (left hip area) when she is moved. Pending PT evaluation

## 2019-09-09 NOTE — Care Management Obs Status (Signed)
MEDICARE OBSERVATION STATUS NOTIFICATION   Patient Details  Name: Stephanie Welch MRN: [id number] Date of Birth: 1927/06/10   Medicare Observation Status Notification Given:  Yes    Margarito Liner, LCSW 09/09/2019, 3:01 PM

## 2019-09-09 NOTE — Progress Notes (Signed)
PROGRESS NOTE    Stephanie Welch  EPP:[id number] DOB: 11-15-1926 DOA: 09/08/2019 PCP: [facility]      Brief Narrative:  Mrs. Leisinger is a 86 y.o. F with mild dementia, ALF dwelling, DM, hypothyroidism and CAD who presented with confusion for 1-2 days.  Per report, patient fell 3 days PTA, developed groin pain.  Over next few days, became clear to family visiting her that she was confused, self-micturated.  In the ER, CXR clear, afebrile, SPO2 97%.  Pelvis x-ray showed pubic rami fractures.            Assessment & Plan:  Altered mental status The patient presents with confusion, oriented only to self, unable to coherently answer my questions, slow to comply with therapy requests. UA without cells.  COVID negative.  Normal electrolytes, no leukocytosis. Suspect her altered mental status is due to pain and pain medication superimposed on her chronic dementia. Delirium precautions:   -Lights and TV off, minimize interruptions at night  -Blinds open and lights on during day  -Glasses/hearing aid with patient  -Frequent reorientation  -PT/OT when able  -Avoid sedation medications/Beers list medications    Pelvis fractures -PT  -Judicious use of analgesics  DM Glucoses well controlled -Hold metformin, sulfonylurea  -Start SS corrections  Afib HR controlled -Continue metoprolol, warfarin  Chronic diastolic CHF Appears euvolemic -Continue Lasix  Hypothyroidism -Continue levothyroxine  Mood disorder -Continue citalopram           Disposition: The patient was admitted with pubic ramus fractures and altered mental status.   At baseline she is able to live independently in an apartment in an independent living facility, with minimal support.  At present, she is only able to walk a few feet with assistance from therapy, and is confused and oriented only to self.    We will pursue skilled nursing placement, and I will discharge when safe  discharge plan is available, hopefully tomorrow.        MDM: The below labs and imaging reports were reviewed and summarized above.  Medication management as above.   DVT prophylaxis: Not applicable, on warfarin Code Status: DO NOT RESUSCITATE Family Communication: Called to POA 3 times, no answer    Consultants:     Procedures:     Antimicrobials:       Subjective: Sleepy, appetite reduced.  No headache, chest pain, respiratory distress, focal weakness, numbness.  She is confused.  Objective: Vitals:   09/08/19 2045 09/08/19 2258 09/08/19 2323 09/09/19 0058  BP: (!) 165/91 (!) 184/95 (!) 161/87 (!) 156/89  Pulse: 98 (!) 109 94 97  Resp: 16 18 16    Temp:    98.6 F (37 C)  TempSrc:    Oral  SpO2: 94% 96% 97% 97%  Weight:    75.5 kg  Height:    5\' 7"  (1.702 m)   No intake or output data in the 24 hours ending 09/09/19 0731 Filed Weights   09/08/19 1152 09/09/19 0058  Weight: 85 kg 75.5 kg    Examination: General appearance: Thin elderly adult female, sleeping but easily arousable and in no acute distress.   HEENT: Anicteric, conjunctiva pink, lids and lashes normal. No nasal deformity, discharge, epistaxis.  Lips moist.   Skin: Warm and dry.  No jaundice.  No suspicious rashes or lesions. Cardiac: RRR, nl S1-S2, no murmurs appreciated.  Capillary refill is brisk.  JVP normal.  No LE edema.  Radial pulses 2+ and symmetric. Respiratory: Normal respiratory  rate and rhythm.  CTAB without rales or wheezes. Abdomen: Abdomen soft.  No TTP or guarding. No ascites, distension, hepatosplenomegaly.   MSK: No deformities or effusions. Neuro: Sleeping but easily arousable, disoriented, makes eye contact.  EOMI, moves all extremities with generalized weakness, pain limits movement of the legs. Speech fluent.    Psych: Oriented to self only, attention diminished, affect pleasant, judgment insight appear impaired   Data Reviewed: I have personally reviewed following  labs and imaging studies:  CBC: Recent Labs  Lab 09/08/19 1201 09/09/19 0158  WBC 6.5 8.4  NEUTROABS 4.4  --   HGB 14.2 12.9  HCT 43.7 39.3  MCV 96.3 95.4  PLT 200 194   Basic Metabolic Panel: Recent Labs  Lab 09/08/19 1201 09/09/19 0158  NA 137 137  K 3.5 3.4*  CL 103 105  CO2 22 24  GLUCOSE 118* 145*  BUN 22 21  CREATININE 0.74 0.76  CALCIUM 8.8* 8.3*   GFR: Estimated Creatinine Clearance: 48.6 mL/min (by C-G formula based on SCr of 0.76 mg/dL). Liver Function Tests: Recent Labs  Lab 09/08/19 1201  AST 28  ALT 16  ALKPHOS 87  BILITOT 1.0  PROT 7.4  ALBUMIN 3.8   No results for input(s): LIPASE, AMYLASE in the last 168 hours. No results for input(s): AMMONIA in the last 168 hours. Coagulation Profile: Recent Labs  Lab 09/09/19 0158  INR 2.0*   Cardiac Enzymes: Recent Labs  Lab 09/08/19 1201  CKTOTAL 235*   BNP (last 3 results) No results for input(s): PROBNP in the last 8760 hours. HbA1C: Recent Labs    09/08/19 1201  HGBA1C 6.3*   CBG: Recent Labs  Lab 09/08/19 1836  GLUCAP 259*   Lipid Profile: No results for input(s): CHOL, HDL, LDLCALC, TRIG, CHOLHDL, LDLDIRECT in the last 72 hours. Thyroid Function Tests: No results for input(s): TSH, T4TOTAL, FREET4, T3FREE, THYROIDAB in the last 72 hours. Anemia Panel: No results for input(s): VITAMINB12, FOLATE, FERRITIN, TIBC, IRON, RETICCTPCT in the last 72 hours. Urine analysis:    Component Value Date/Time   COLORURINE YELLOW (A) 09/08/2019 1201   APPEARANCEUR HAZY (A) 09/08/2019 1201   LABSPEC 1.019 09/08/2019 1201   PHURINE 5.0 09/08/2019 1201   GLUCOSEU NEGATIVE 09/08/2019 1201   HGBUR NEGATIVE 09/08/2019 1201   BILIRUBINUR NEGATIVE 09/08/2019 1201   KETONESUR 5 (A) 09/08/2019 1201   PROTEINUR 30 (A) 09/08/2019 1201   NITRITE NEGATIVE 09/08/2019 1201   LEUKOCYTESUR NEGATIVE 09/08/2019 1201   Sepsis Labs: @LABRCNTIP (procalcitonin:4,lacticacidven:4)  )No results found for this  or any previous visit (from the past 240 hour(s)).       Radiology Studies: DG Chest 1 View  Result Date: 09/08/2019 CLINICAL DATA:  Fall with weakness. EXAM: CHEST  1 VIEW COMPARISON:  01/22/2016 FINDINGS: Chronic cardiomegaly and aortic atherosclerosis. Chronic prominent interstitial lung markings. No sign of infiltrate, mass, effusion or collapse. No significant bone finding. IMPRESSION: Chronic cardiomegaly and aortic atherosclerosis. Chronic prominent interstitial lung markings without evidence of superimposed acute finding. Electronically Signed   By: 01/24/2016 M.D.   On: 09/08/2019 12:43   DG Pelvis 1-2 Views  Result Date: 09/08/2019 CLINICAL DATA:  Patient status post fall 09/04/2018. Left hip pain. Initial encounter. EXAM: PELVIS - 1-2 VIEW COMPARISON:  CT abdomen and pelvis 04/01/2019 FINDINGS: The patient has acute fractures of the left superior and inferior pubic rami. No hip fracture is identified. The hips are located. Right hip replacement noted. IMPRESSION: Acute left superior and inferior pubic rami  fractures. No other acute abnormality. Electronically Signed   By: Drusilla Kanner M.D.   On: 09/08/2019 12:43   CT Head Wo Contrast  Result Date: 09/08/2019 CLINICAL DATA:  Altered mental status. EXAM: CT HEAD WITHOUT CONTRAST TECHNIQUE: Contiguous axial images were obtained from the base of the skull through the vertex without intravenous contrast. COMPARISON:  October 20, 2015. FINDINGS: Brain: Mild diffuse cortical atrophy is noted. Mild chronic ischemic white matter disease is noted. Old left occipital infarction is noted. No mass effect or midline shift is noted. Ventricular size is within normal limits. There is no evidence of mass lesion, hemorrhage or acute infarction. Vascular: No hyperdense vessel or unexpected calcification. Skull: Normal. Negative for fracture or focal lesion. Sinuses/Orbits: No acute finding. Other: None. IMPRESSION: Mild diffuse cortical atrophy. Mild  chronic ischemic white matter disease. Old left occipital infarction. No acute intracranial abnormality seen. Electronically Signed   By: Lupita Raider M.D.   On: 09/08/2019 12:23        Scheduled Meds: . citalopram  10 mg Oral Daily  . furosemide  20 mg Oral Daily  . insulin aspart  0-6 Units Subcutaneous TID WC  . levothyroxine  100 mcg Oral QAC breakfast  . metoprolol succinate  150 mg Oral Q breakfast  . warfarin  2.5 mg Oral Once per day on Mon Tue   And  . [START ON 09/10/2019] warfarin  5 mg Oral Once per day on Sun Wed Thu Fri Sat   Continuous Infusions: . sodium chloride 75 mL/hr at 09/09/19 0146     LOS: 0 days    Time spent: 25 minutes    Alberteen Sam, MD [facility] 09/09/2019, 7:31 AM     Please page though AMION or Epic secure chat:  For [employer], Higher education careers adviser

## 2019-09-09 NOTE — Evaluation (Signed)
Physical Therapy Evaluation Patient Details Name: Stephanie Welch MRN: [id number] DOB: [birth year] Today's Date: 09/09/2019   History of Present Illness   86 y.o. female with past medical history significant for hypertension, diabetes, mild dementia, macular degeneration who apparently had a fall a few 3 days ago.  Started having more confusion and difficulty with mobility/confusion and comes in from ALF (?)  Pt found to have pelvic fx.  Clinical Impression  Pt pleasant though somewhat confused t/o the session, eager to work with PT and though she had some pain with light in-bed exercises, during transitions and with WBing she ultimately was not overly limited due to this.  She was able to rise to standing needing some physical assist, but once up was able to do small, shuffling steps mostly with only cuing and close supervision.  Pt managed ~8 ft and though she likely could have done a little more she was very slow and guarded with the effort.  Unsure what exact setting pt came in from (ALF, ILF, memory care, ?) but she likely will get back to her baseline relatively quickly with some STR as pain and medical status improves.  Follow Up Recommendations SNF;Supervision/Assistance - 24 hour    Equipment Recommendations  (pt uses SPC, given h/o hip fx she likely has RW)    Recommendations for Other Services       Precautions / Restrictions Precautions Precautions: Fall Restrictions Weight Bearing Restrictions: (no formal WBing orders, no ortho orders)      Mobility  Bed Mobility Overal bed mobility: Modified Independent;Needs Assistance Bed Mobility: Supine to Sit     Supine to sit: Min assist     General bed mobility comments: Pt showed good effort in getting to sitting EOB, ultimately needed assist with LEs as well as with trunk  Transfers Overall transfer level: Needs assistance Equipment used: Rolling walker (2 wheeled) Transfers: Sit to/from Stand Sit to Stand: Min assist         General transfer comment: Pt did relatively well getting weight forward and transitioning to standing - did need light assist even from raised bed height  Ambulation/Gait Ambulation/Gait assistance: Min guard Gait Distance (Feet): 8 Feet Assistive device: Rolling walker (2 wheeled)       General Gait Details: Pt hesitant to take larger steps but is able to manage b/l small shuffling cadence with heavy walker reliance.  Pt fatigued with the effort and reports some increased pain with activity but generally did well with limited in-room mobility.  Stairs            Wheelchair Mobility    Modified Rankin (Stroke Patients Only)       Balance Overall balance assessment: Needs assistance Sitting-balance support: Bilateral upper extremity supported Sitting balance-Leahy Scale: Fair     Standing balance support: Bilateral upper extremity supported Standing balance-Leahy Scale: Fair Standing balance comment: reliant on the walker, no LOBs but slow and guarded with all standing tasks                             Pertinent Vitals/Pain Pain Assessment: Faces Faces Pain Scale: Hurts little more Pain Location: Pt indicates no pain at rest, showed hesitancy with most acts    Home Living Family/patient expects to be discharged to:: Unsure(appears she is from assisted living)                      Prior Function  Comments: Pt confused enough she cannot relay how much/if she needs help but states that normally she uses her SPC.     Hand Dominance        Extremity/Trunk Assessment   Upper Extremity Assessment Upper Extremity Assessment: Generalized weakness(age appropriate limitations)    Lower Extremity Assessment Lower Extremity Assessment: Generalized weakness       Communication   Communication: No difficulties  Cognition Arousal/Alertness: Awake/alert Behavior During Therapy: Anxious;WFL for tasks assessed/performed Overall  Cognitive Status: Difficult to assess                                 General Comments: Pt with confusion, unsure how close to baseline this is.  She had fogotten it was her birthday, unsure of year, was able to reason that she was at the hospital but did not remember fall, etc      General Comments      Exercises     Assessment/Plan    PT Assessment Patient needs continued PT services  PT Problem List Decreased range of motion;Decreased activity tolerance;Decreased balance;Decreased coordination;Decreased mobility;Decreased cognition;Decreased safety awareness;Decreased knowledge of use of DME       PT Treatment Interventions DME instruction;Gait training;Functional mobility training;Therapeutic activities;Therapeutic exercise;Balance training;Neuromuscular re-education;Patient/family education    PT Goals (Current goals can be found in the Care Plan section)  Acute Rehab PT Goals Patient Stated Goal: go back to her home PT Goal Formulation: With patient Time For Goal Achievement: 09/23/19 Potential to Achieve Goals: Fair    Frequency 7X/week   Barriers to discharge        Co-evaluation               AM-PAC PT "6 Clicks" Mobility  Outcome Measure Help needed turning from your back to your side while in a flat bed without using bedrails?: A Little Help needed moving from lying on your back to sitting on the side of a flat bed without using bedrails?: A Lot Help needed moving to and from a bed to a chair (including a wheelchair)?: A Little Help needed standing up from a chair using your arms (e.g., wheelchair or bedside chair)?: A Little Help needed to walk in hospital room?: A Lot Help needed climbing 3-5 steps with a railing? : A Lot 6 Click Score: 15    End of Session Equipment Utilized During Treatment: Gait belt Activity Tolerance: Patient tolerated treatment well Patient left: with chair alarm set;with call bell/phone within reach Nurse  Communication: Mobility status(flushed 500 CC urine) PT Visit Diagnosis: Muscle weakness (generalized) (M62.81);Difficulty in walking, not elsewhere classified (R26.2);Pain Pain - Right/Left: Left Pain - part of body: Hip    Time: 1110-1143 PT Time Calculation (min) (ACUTE ONLY): 33 min   Charges:   PT Evaluation $PT Eval Low Complexity: 1 Low PT Treatments $Therapeutic Activity: 8-22 mins        Kreg Shropshire, DPT 09/09/2019, 1:14 PM

## 2019-09-10 DIAGNOSIS — S32511A Fracture of superior rim of right pubis, initial encounter for closed fracture: Secondary | ICD-10-CM | POA: Diagnosis not present

## 2019-09-10 DIAGNOSIS — R4182 Altered mental status, unspecified: Secondary | ICD-10-CM | POA: Diagnosis not present

## 2019-09-10 DIAGNOSIS — S32591A Other specified fracture of right pubis, initial encounter for closed fracture: Secondary | ICD-10-CM | POA: Diagnosis not present

## 2019-09-10 DIAGNOSIS — S32592A Other specified fracture of left pubis, initial encounter for closed fracture: Secondary | ICD-10-CM | POA: Diagnosis not present

## 2019-09-10 LAB — CBC
HCT: 38.8 % (ref 36.0–46.0)
Hemoglobin: 12.5 g/dL (ref 12.0–15.0)
MCH: 31.2 pg (ref 26.0–34.0)
MCHC: 32.2 g/dL (ref 30.0–36.0)
MCV: 96.8 fL (ref 80.0–100.0)
Platelets: 190 10*3/uL (ref 150–400)
RBC: 4.01 MIL/uL (ref 3.87–5.11)
RDW: 13.1 % (ref 11.5–15.5)
WBC: 5.5 10*3/uL (ref 4.0–10.5)
nRBC: 0 % (ref 0.0–0.2)

## 2019-09-10 LAB — GLUCOSE, CAPILLARY
Glucose-Capillary: 124 mg/dL — ABNORMAL HIGH (ref 70–99)
Glucose-Capillary: 165 mg/dL — ABNORMAL HIGH (ref 70–99)
Glucose-Capillary: 120 mg/dL — ABNORMAL HIGH (ref 70–99)

## 2019-09-10 LAB — PROTIME-INR
INR: 1.7 — ABNORMAL HIGH (ref 0.8–1.2)
Prothrombin Time: 20.3 seconds — ABNORMAL HIGH (ref 11.4–15.2)

## 2019-09-10 LAB — BASIC METABOLIC PANEL
Anion gap: 5 (ref 5–15)
BUN: 17 mg/dL (ref 8–23)
CO2: 27 mmol/L (ref 22–32)
Calcium: 8.3 mg/dL — ABNORMAL LOW (ref 8.9–10.3)
Chloride: 107 mmol/L (ref 98–111)
Creatinine, Ser: 0.75 mg/dL (ref 0.44–1.00)
GFR calc Af Amer: >60 mL/min (ref >60)
GFR calc non Af Amer: >60 mL/min (ref >60)
Glucose, Bld: 136 mg/dL — ABNORMAL HIGH (ref 70–99)
Potassium: 3.5 mmol/L (ref 3.5–5.1)
Sodium: 139 mmol/L (ref 135–145)

## 2019-09-10 MED ORDER — TRAMADOL HCL 50 MG PO TABS: 50.0000 mg | ORAL_TABLET | Freq: Two times a day (BID) | ORAL | 0 refills | Status: AC | PRN

## 2019-09-10 NOTE — Progress Notes (Signed)
D: Pt alert and oriented. Pt denies experiencing any pain at this time.  A: Pt and care facility received discharge and medication education/information. Pt belongings were gathered and taken with pt upon discharge.   R: Pt and care facility verbalized understanding of discharge and medication education/information.  Pt transported via EMS to [facility] where [facility] received report.

## 2019-09-10 NOTE — Discharge Summary (Addendum)
[facility] Discharge Summary   Patient: Stephanie Welch DJM:[id number]   PCP: [facility] DOB: [birth year]   Date of admission: 09/08/2019   Date of discharge:  09/10/2019    Discharge Diagnoses:   Principal Problem:   Bilateral pubic rami fractures, closed, initial encounter (HCC) Active Problems:   HTN (hypertension)   Diabetes mellitus without complication (HCC)   Altered mental status   Atrial fibrillation, chronic (HCC)   HFrEF (heart failure with reduced ejection fraction) (HCC)   Closed bilateral fracture of pubic rami (HCC)   AMS (altered mental status)   Admitted From: ALF Disposition:  SNF   Recommendations for Outpatient Follow-up:  1. PCP: please follow up in 1 week 2. Follow up LABS/TEST:  INR  Follow-up Information    [place], [facility]. Schedule an appointment as soon as possible for a visit in 1 week(s).   Contact information: [address] [place] [address] [place] [address] [phone]          Diet recommendation: Cardiac diet  Activity: The patient is advised to gradually reintroduce usual activities,as tolerated  Discharge Condition: good  Code Status: DNR   History of present illness: As per the H and P dictated on admission, "Stephanie Welch is an 86 y.o. female with past medical history significant for hypertension, diabetes, mild dementia, macular degeneration with previous history of hip fracture who is sent to assisted living facility for altered mental status.    History as per ED physician and son from the patient. Attempt to call [facility] friend Burna Sis unsuccessful, no answer.   Patient apparently fell 3 days ago.  She was not seen but did have some hip pain.  Of note patient is on Coumadin.  She apparently was doing okay with family visiting her.  Per ED physician, patient's family visited her yesterday and when they left she was sitting in a chair apparently comfortable.  This morning when they went to see her she was  apparently still in the chair and had urinated on herself and was confused.  This is very unusual for the patient so they brought her in.  Patient herself does not recall much of this.  She said she does remember waking up in the chair but thinks that she sleeps in a chair not infrequently.  She did not remember having urinated on herself.  She is surprised that anybody thought she was confused as she does not feel confused.  She did not really remember falling but does note that her left hip hurts.  [facility] is not entirely sure of the date but does know who she is. Patient does admit that she has memory problems.  She states she has no family, has never been married and has no children.  She states that she has a very good friend but cannot remember her name.  When I asked her if her name would be in the chart she said "[facility]:  Summary of her active problems in the hospital is as following. Altered mental status, acute metabolic encephalopathy The patient presents with confusion, oriented only to self, unable to coherently answer my questions, slow to comply with therapy requests. UA without cells.  COVID negative.  Normal electrolytes, no leukocytosis. Suspect her altered mental status is due to pain and pain medication superimposed on her chronic dementia. Delirium precautions:   Pelvis fractures -PT  -Judicious use of  analgesics  Type 2 DM controlled -resume metformin, sulfonylurea   Chronic Afib HR controlled -Continue metoprolol, warfarin INR subtherapeutic, for now recommendation is to continue same dose with close recheck in 1 week  Chronic diastolic CHF Appears euvolemic -Continue Lasix  Hypothyroidism -Continue levothyroxine  Mood disorder -Continue citalopram  Pain control  - [place] Controlled Substance Reporting System database was reviewed. - 3 day supply was provided. - Patient  was instructed, not to drive, operate heavy machinery, perform activities at heights, swimming or participation in water activities or provide baby sitting services while on Pain, Sleep and Anxiety Medications; until her outpatient Physician has advised to do so again.  - Also recommended to not to take more than prescribed Pain, Sleep and Anxiety Medications.  Patient was seen by physical therapy, who recommended SNF, which was arranged. On the day of the discharge the patient's vitals were stable, and no other acute medical condition were reported by patient. the patient was felt safe to be discharge at SNF with SNF.  Consultants: none Procedures: none  DISCHARGE MEDICATION: Allergies as of 09/10/2019      Reactions   Conjugated Estrogens    Other reaction(s): Unknown      Medication List    TAKE these medications   cetirizine 10 MG tablet Commonly known as: ZYRTEC Take 10 mg by mouth daily.   cholecalciferol 25 MCG (1000 UNIT) tablet Commonly known as: VITAMIN D3 Take 1,000 Units by mouth daily.   citalopram 10 MG tablet Commonly known as: CELEXA Take 10 mg by mouth daily.   Coumadin 2.5 MG tablet Generic drug: warfarin Take 2.5-5 mg by mouth See admin instructions. Take 1 tablet (2.5mg ) by mouth daily on Monday and Tuesday and take 2 tablets (5mg ) by mouth daily on Wednesday, Thursday, Friday, Saturday and Sunday   furosemide 20 MG tablet Commonly known as: LASIX Take 20 mg by mouth daily.   glimepiride 2 MG tablet Commonly known as: AMARYL Take 2 mg by mouth every morning.   levothyroxine 100 MCG tablet Commonly known as: SYNTHROID Take 100 mcg by mouth daily before breakfast.   metFORMIN 500 MG tablet Commonly known as: GLUCOPHAGE Take 500 mg by mouth 2 (two) times daily.   metoprolol succinate 50 MG 24 hr tablet Commonly known as: TOPROL-XL Take 150 mg by mouth daily with breakfast.   OCUVITE-LUTEIN PO Take 1 tablet by mouth every morning.   traMADol 50  MG tablet Commonly known as: Ultram Take 1 tablet (50 mg total) by mouth every 12 (twelve) hours as needed for up to 3 days.      Allergies  Allergen Reactions  . Conjugated Estrogens     Other reaction(s): Unknown   Discharge Instructions    Diet - low sodium heart healthy   Complete by: As directed    Increase activity slowly   Complete by: As directed      Discharge Exam: Filed Weights   09/08/19 1152 09/09/19 0058  Weight: 85 kg 75.5 kg   Vitals:   09/10/19 0614 09/10/19 0810  BP: (!) 150/80 (!) 150/81  Pulse: 86 72  Resp: 14 15  Temp: 98.3 F (36.8 C) 98.1 F (36.7 C)  SpO2: 95% 97%   General: Appear in no distress, no Rash; Oral Mucosa Clear, moist. no Abnormal Mass Or lumps Cardiovascular: S1 and S2 Present, no Murmur, Respiratory: normal respiratory effort, Bilateral Air entry present and Clear to Auscultation, no Crackles, no wheezes Abdomen: Bowel Sound present, Soft and no  tenderness, no hernia Extremities: no Pedal edema, no calf tenderness Neurology: alert and oriented to place and person affect appropriate.  The results of significant diagnostics from this hospitalization (including imaging, microbiology, ancillary and laboratory) are listed below for reference.    Significant Diagnostic Studies: DG Chest 1 View  Result Date: 09/08/2019 CLINICAL DATA:  Fall with weakness. EXAM: CHEST  1 VIEW COMPARISON:  01/22/2016 FINDINGS: Chronic cardiomegaly and aortic atherosclerosis. Chronic prominent interstitial lung markings. No sign of infiltrate, mass, effusion or collapse. No significant bone finding. IMPRESSION: Chronic cardiomegaly and aortic atherosclerosis. Chronic prominent interstitial lung markings without evidence of superimposed acute finding. Electronically Signed   By: Paulina Fusi M.D.   On: 09/08/2019 12:43   DG Pelvis 1-2 Views  Result Date: 09/08/2019 CLINICAL DATA:  Patient status post fall 09/04/2018. Left hip pain. Initial encounter. EXAM:  PELVIS - 1-2 VIEW COMPARISON:  CT abdomen and pelvis 04/01/2019 FINDINGS: The patient has acute fractures of the left superior and inferior pubic rami. No hip fracture is identified. The hips are located. Right hip replacement noted. IMPRESSION: Acute left superior and inferior pubic rami fractures. No other acute abnormality. Electronically Signed   By: Drusilla Kanner M.D.   On: 09/08/2019 12:43   CT Head Wo Contrast  Result Date: 09/08/2019 CLINICAL DATA:  Altered mental status. EXAM: CT HEAD WITHOUT CONTRAST TECHNIQUE: Contiguous axial images were obtained from the base of the skull through the vertex without intravenous contrast. COMPARISON:  October 20, 2015. FINDINGS: Brain: Mild diffuse cortical atrophy is noted. Mild chronic ischemic white matter disease is noted. Old left occipital infarction is noted. No mass effect or midline shift is noted. Ventricular size is within normal limits. There is no evidence of mass lesion, hemorrhage or acute infarction. Vascular: No hyperdense vessel or unexpected calcification. Skull: Normal. Negative for fracture or focal lesion. Sinuses/Orbits: No acute finding. Other: None. IMPRESSION: Mild diffuse cortical atrophy. Mild chronic ischemic white matter disease. Old left occipital infarction. No acute intracranial abnormality seen. Electronically Signed   By: Lupita Raider M.D.   On: 09/08/2019 12:23    Microbiology: Recent Results (from the past 240 hour(s))  SARS CORONAVIRUS 2 (TAT 6-24 HRS) Nasopharyngeal Nasopharyngeal Swab     Status: None   Collection Time: 09/08/19 10:30 PM   Specimen: Nasopharyngeal Swab  Result Value Ref Range Status   SARS Coronavirus 2 NEGATIVE NEGATIVE Final    Comment: (NOTE) SARS-CoV-2 target nucleic acids are NOT DETECTED. The SARS-CoV-2 RNA is generally detectable in upper and lower respiratory specimens during the acute phase of infection. Negative results do not preclude SARS-CoV-2 infection, do not rule  out co-infections with other pathogens, and should not be used as the sole basis for treatment or other patient management decisions. Negative results must be combined with clinical observations, patient history, and epidemiological information. The expected result is Negative. Fact Sheet for Patients: HairSlick.no Fact Sheet for Healthcare Providers: quierodirigir.com This test is not yet approved or cleared by the [place] FDA and  has been authorized for detection and/or diagnosis of SARS-CoV-2 by FDA under an Emergency Use Authorization (EUA). This EUA will remain  in effect (meaning this test can be used) for the duration of the COVID-19 declaration under Section 56 4(b)(1) of the Act, 21 U.S.C. section 360bbb-3(b)(1), unless the authorization is terminated or revoked sooner. Performed at [facility], [address] [place], [place] [address]    Labs: CBC: Recent Labs  Lab 09/08/19 1201 09/09/19 0158  09/10/19 0606  WBC 6.5 8.4 5.5  NEUTROABS 4.4  --   --   HGB 14.2 12.9 12.5  HCT 43.7 39.3 38.8  MCV 96.3 95.4 96.8  PLT 200 194 190   Basic Metabolic Panel: Recent Labs  Lab 09/08/19 1201 09/09/19 0158 09/10/19 0606  NA 137 137 139  K 3.5 3.4* 3.5  CL 103 105 107  CO2 22 24 27   GLUCOSE 118* 145* 136*  BUN 22 21 17   CREATININE 0.74 0.76 0.75  CALCIUM 8.8* 8.3* 8.3*   Liver Function Tests: Recent Labs  Lab 09/08/19 1201  AST 28  ALT 16  ALKPHOS 87  BILITOT 1.0  PROT 7.4  ALBUMIN 3.8   No results for input(s): LIPASE, AMYLASE in the last 168 hours. No results for input(s): AMMONIA in the last 168 hours. Cardiac Enzymes: Recent Labs  Lab 09/08/19 1201  CKTOTAL 235*   BNP (last 3 results) No results for input(s): BNP in the last 8760 hours. CBG: Recent Labs  Lab 09/09/19 0835 09/09/19 1152 09/09/19 1659 09/09/19 2049 09/10/19 0809  GLUCAP 144* 162* 177* 190* 124*    Time  spent: 35 minutes  Signed:  11/07/19  [facility]  09/10/2019 9:03 AM

## 2019-09-10 NOTE — TOC Transition Note (Signed)
Transition of Care [facility]) - CM/SW Discharge Note   Patient Details  Name: Stephanie Welch MRN: [id number] Date of Birth: Sep 25, 1926  [facility]) CM/SW Contact:  Barrie Dunker, RN Phone Number: 09/10/2019, 9:52 AM   Clinical Narrative:     Patient to discharge to the [facility] section today via EMS transport I called and left a message with Cala Bradford at the [facility] [place], [place] notes show that the patient's bed is ready.  Bedside Nurse to call report to the [facility] [place] and then call EMS to transport when ready, Bosie Clos the family friend has been made aware  Final next level of care: Skilled Nursing Facility Barriers to Discharge: Barriers Resolved   Patient Goals and CMS Choice     Choice offered to / list presented to : NA  Discharge Placement              Patient chooses bed at: [facility]) Patient to be transferred to facility by: EMS Name of family member notified: Bosie Clos Patient and family notified of of transfer: 09/10/19  Discharge Plan and Services     Post Acute Care Choice: Skilled Nursing Facility                               Social Determinants of Health (SDOH) Interventions     Readmission Risk Interventions No flowsheet data found.

## 2019-09-10 NOTE — Progress Notes (Signed)
Report given to [place] at [facility] [place]

## 2019-09-17 ENCOUNTER — Other Ambulatory Visit
Admission: RE | Admit: 2019-09-17 | Discharge: 2019-09-17 | Disposition: A | Discharge status: Home / Self Care | Payer: Medicare PPO | Source: Skilled Nursing Facility | Attending: Internal Medicine | Admitting: Internal Medicine

## 2019-09-17 DIAGNOSIS — Z7901 Long term (current) use of anticoagulants: Secondary | ICD-10-CM | POA: Insufficient documentation

## 2019-09-17 DIAGNOSIS — I4891 Unspecified atrial fibrillation: Secondary | ICD-10-CM | POA: Diagnosis present

## 2019-09-17 LAB — PROTIME-INR
INR: 1.4 — ABNORMAL HIGH (ref 0.8–1.2)
Prothrombin Time: 16.8 seconds — ABNORMAL HIGH (ref 11.4–15.2)

## 2019-10-16 ENCOUNTER — Encounter
Admission: RE | Admit: 2019-10-16 | Discharge: 2019-10-16 | Disposition: A | Discharge status: Home / Self Care | Payer: Medicare PPO | Source: Ambulatory Visit | Attending: Internal Medicine | Admitting: Internal Medicine

## 2019-10-22 ENCOUNTER — Other Ambulatory Visit
Admission: RE | Admit: 2019-10-22 | Discharge: 2019-10-22 | Disposition: A | Discharge status: Home / Self Care | Payer: Medicare PPO | Source: Skilled Nursing Facility | Attending: Internal Medicine | Admitting: Internal Medicine

## 2019-10-22 DIAGNOSIS — Z0189 Encounter for other specified special examinations: Secondary | ICD-10-CM | POA: Insufficient documentation

## 2019-10-22 DIAGNOSIS — I4891 Unspecified atrial fibrillation: Secondary | ICD-10-CM | POA: Insufficient documentation

## 2019-10-22 DIAGNOSIS — Z7901 Long term (current) use of anticoagulants: Secondary | ICD-10-CM | POA: Insufficient documentation

## 2019-10-22 LAB — PROTIME-INR
INR: 1.7 — ABNORMAL HIGH (ref 0.8–1.2)
Prothrombin Time: 19.7 seconds — ABNORMAL HIGH (ref 11.4–15.2)

## 2019-11-05 ENCOUNTER — Other Ambulatory Visit
Admission: RE | Admit: 2019-11-05 | Discharge: 2019-11-05 | Disposition: A | Discharge status: Home / Self Care | Payer: Medicare PPO | Source: Ambulatory Visit | Attending: Internal Medicine | Admitting: Internal Medicine

## 2019-11-05 DIAGNOSIS — I4891 Unspecified atrial fibrillation: Secondary | ICD-10-CM | POA: Insufficient documentation

## 2019-11-05 LAB — PROTIME-INR
INR: 2.2 — ABNORMAL HIGH (ref 0.8–1.2)
Prothrombin Time: 24.3 seconds — ABNORMAL HIGH (ref 11.4–15.2)

## 2019-12-03 ENCOUNTER — Other Ambulatory Visit
Admission: RE | Admit: 2019-12-03 | Discharge: 2019-12-03 | Disposition: A | Discharge status: Home / Self Care | Payer: Medicare PPO | Source: Ambulatory Visit | Attending: Internal Medicine | Admitting: Internal Medicine

## 2019-12-03 DIAGNOSIS — I4891 Unspecified atrial fibrillation: Secondary | ICD-10-CM | POA: Insufficient documentation

## 2019-12-03 LAB — PROTIME-INR
INR: 2 — ABNORMAL HIGH (ref 0.8–1.2)
Prothrombin Time: 22.9 seconds — ABNORMAL HIGH (ref 11.4–15.2)

## 2019-12-22 ENCOUNTER — Emergency Department: Payer: Medicare PPO

## 2019-12-22 ENCOUNTER — Other Ambulatory Visit: Payer: Self-pay

## 2019-12-22 ENCOUNTER — Emergency Department
Admission: EM | Admit: 2019-12-22 | Discharge: 2019-12-23 | Disposition: A | Discharge status: Home / Self Care | Payer: Medicare PPO | Attending: Emergency Medicine | Admitting: Emergency Medicine

## 2019-12-22 DIAGNOSIS — Z79899 Other long term (current) drug therapy: Secondary | ICD-10-CM | POA: Diagnosis not present

## 2019-12-22 DIAGNOSIS — Z7984 Long term (current) use of oral hypoglycemic drugs: Secondary | ICD-10-CM | POA: Diagnosis not present

## 2019-12-22 DIAGNOSIS — R519 Headache, unspecified: Secondary | ICD-10-CM | POA: Insufficient documentation

## 2019-12-22 DIAGNOSIS — S79911A Unspecified injury of right hip, initial encounter: Secondary | ICD-10-CM | POA: Diagnosis present

## 2019-12-22 DIAGNOSIS — Y92121 Bathroom in nursing home as the place of occurrence of the external cause: Secondary | ICD-10-CM | POA: Insufficient documentation

## 2019-12-22 DIAGNOSIS — Y93E8 Activity, other personal hygiene: Secondary | ICD-10-CM | POA: Insufficient documentation

## 2019-12-22 DIAGNOSIS — I2510 Atherosclerotic heart disease of native coronary artery without angina pectoris: Secondary | ICD-10-CM | POA: Diagnosis not present

## 2019-12-22 DIAGNOSIS — Y999 Unspecified external cause status: Secondary | ICD-10-CM | POA: Diagnosis not present

## 2019-12-22 DIAGNOSIS — Z96641 Presence of right artificial hip joint: Secondary | ICD-10-CM | POA: Diagnosis not present

## 2019-12-22 DIAGNOSIS — S32591A Other specified fracture of right pubis, initial encounter for closed fracture: Secondary | ICD-10-CM | POA: Diagnosis not present

## 2019-12-22 DIAGNOSIS — E119 Type 2 diabetes mellitus without complications: Secondary | ICD-10-CM | POA: Insufficient documentation

## 2019-12-22 DIAGNOSIS — W1812XA Fall from or off toilet with subsequent striking against object, initial encounter: Secondary | ICD-10-CM | POA: Diagnosis not present

## 2019-12-22 DIAGNOSIS — Z7901 Long term (current) use of anticoagulants: Secondary | ICD-10-CM | POA: Insufficient documentation

## 2019-12-22 DIAGNOSIS — S32592A Other specified fracture of left pubis, initial encounter for closed fracture: Secondary | ICD-10-CM | POA: Diagnosis not present

## 2019-12-22 DIAGNOSIS — Z20822 Contact with and (suspected) exposure to covid-19: Secondary | ICD-10-CM | POA: Diagnosis not present

## 2019-12-22 LAB — COMPREHENSIVE METABOLIC PANEL
ALT: 10 U/L (ref 0–44)
AST: 28 U/L (ref 15–41)
Albumin: 4.1 g/dL (ref 3.5–5.0)
Alkaline Phosphatase: 97 U/L (ref 38–126)
Anion gap: 10 (ref 5–15)
BUN: 29 mg/dL — ABNORMAL HIGH (ref 8–23)
CO2: 25 mmol/L (ref 22–32)
Calcium: 9.3 mg/dL (ref 8.9–10.3)
Chloride: 104 mmol/L (ref 98–111)
Creatinine, Ser: 1.33 mg/dL — ABNORMAL HIGH (ref 0.44–1.00)
GFR calc Af Amer: 40 mL/min — ABNORMAL LOW (ref >60)
GFR calc non Af Amer: 35 mL/min — ABNORMAL LOW (ref >60)
Glucose, Bld: 112 mg/dL — ABNORMAL HIGH (ref 70–99)
Potassium: 4.7 mmol/L (ref 3.5–5.1)
Sodium: 139 mmol/L (ref 135–145)
Total Bilirubin: 1 mg/dL (ref 0.3–1.2)
Total Protein: 7.9 g/dL (ref 6.5–8.1)

## 2019-12-22 LAB — CBC WITH DIFFERENTIAL/PLATELET
Abs Immature Granulocytes: 0.04 10*3/uL (ref 0.00–0.07)
Basophils Absolute: 0.1 10*3/uL (ref 0.0–0.1)
Basophils Relative: 1 %
Eosinophils Absolute: 0.2 10*3/uL (ref 0.0–0.5)
Eosinophils Relative: 3 %
HCT: 45.4 % (ref 36.0–46.0)
Hemoglobin: 14.1 g/dL (ref 12.0–15.0)
Immature Granulocytes: 1 %
Lymphocytes Relative: 30 %
Lymphs Abs: 1.6 10*3/uL (ref 0.7–4.0)
MCH: 30.1 pg (ref 26.0–34.0)
MCHC: 31.1 g/dL (ref 30.0–36.0)
MCV: 96.8 fL (ref 80.0–100.0)
Monocytes Absolute: 0.6 10*3/uL (ref 0.1–1.0)
Monocytes Relative: 12 %
Neutro Abs: 2.8 10*3/uL (ref 1.7–7.7)
Neutrophils Relative %: 53 %
Platelets: 208 10*3/uL (ref 150–400)
RBC: 4.69 MIL/uL (ref 3.87–5.11)
RDW: 13.7 % (ref 11.5–15.5)
WBC: 5.3 10*3/uL (ref 4.0–10.5)
nRBC: 0 % (ref 0.0–0.2)

## 2019-12-22 NOTE — ED Triage Notes (Signed)
Pt arrives [facility] from [place] [facility]. Pt fell at 21:00 off toilet while reaching for trash and hit head. Pt originally complained of head pain and is on blood thinners so facility called 911. Pt now complains of R hip pain on palpation  BP 173/98 95% room air P 75

## 2019-12-23 LAB — GLUCOSE, CAPILLARY: Glucose-Capillary: 105 mg/dL — ABNORMAL HIGH (ref 70–99)

## 2019-12-23 LAB — RESPIRATORY PANEL BY RT PCR (FLU A&B, COVID)
Influenza A by PCR: NEGATIVE
Influenza B by PCR: NEGATIVE
SARS Coronavirus 2 by RT PCR: NEGATIVE

## 2019-12-23 LAB — TYPE AND SCREEN
ABO/RH(D): AB NEG
Antibody Screen: NEGATIVE

## 2019-12-23 LAB — PROTIME-INR
INR: 2 — ABNORMAL HIGH (ref 0.8–1.2)
Prothrombin Time: 22.7 seconds — ABNORMAL HIGH (ref 11.4–15.2)

## 2019-12-23 LAB — APTT: aPTT: 46 seconds — ABNORMAL HIGH (ref 24–36)

## 2019-12-23 MED ORDER — METOPROLOL SUCCINATE ER 50 MG PO TB24
150.0000 mg | ORAL_TABLET | Freq: Every day | ORAL | Status: DC
  Filled 2019-12-23: qty 3

## 2019-12-23 MED ORDER — WARFARIN SODIUM 5 MG PO TABS: 5.0000 mg | ORAL_TABLET | Freq: Every day | ORAL | Status: DC

## 2019-12-23 MED ORDER — LORATADINE 10 MG PO TABS
10.0000 mg | ORAL_TABLET | Freq: Every day | ORAL | Status: DC
  Administered 2019-12-23: 09:00:00 10 mg via ORAL
  Filled 2019-12-23: qty 1

## 2019-12-23 MED ORDER — ACETAMINOPHEN 325 MG PO TABS: 650.0000 mg | ORAL_TABLET | Freq: Four times a day (QID) | ORAL | 0 refills | Status: AC | PRN

## 2019-12-23 MED ORDER — LEVOTHYROXINE SODIUM 50 MCG PO TABS
100.0000 ug | ORAL_TABLET | Freq: Every day | ORAL | Status: DC
  Administered 2019-12-23: 100 ug via ORAL
  Filled 2019-12-23: qty 2

## 2019-12-23 MED ORDER — OCUVITE-LUTEIN PO CAPS
1.0000 | ORAL_CAPSULE | ORAL | Status: DC
  Administered 2019-12-23: 1 via ORAL
  Filled 2019-12-23 (×2): qty 1

## 2019-12-23 MED ORDER — FUROSEMIDE 40 MG PO TABS
20.0000 mg | ORAL_TABLET | Freq: Every day | ORAL | Status: DC
  Administered 2019-12-23: 20 mg via ORAL
  Filled 2019-12-23: qty 1

## 2019-12-23 MED ORDER — VITAMIN D 25 MCG (1000 UNIT) PO TABS
1000.0000 [IU] | ORAL_TABLET | Freq: Every day | ORAL | Status: DC
  Administered 2019-12-23: 1000 [IU] via ORAL
  Filled 2019-12-23: qty 1

## 2019-12-23 MED ORDER — METFORMIN HCL 500 MG PO TABS
500.0000 mg | ORAL_TABLET | Freq: Two times a day (BID) | ORAL | Status: DC
  Administered 2019-12-23: 500 mg via ORAL
  Filled 2019-12-23: qty 1

## 2019-12-23 MED ORDER — CITALOPRAM HYDROBROMIDE 20 MG PO TABS
10.0000 mg | ORAL_TABLET | Freq: Every day | ORAL | Status: DC
  Administered 2019-12-23: 10 mg via ORAL
  Filled 2019-12-23: qty 1

## 2019-12-23 MED ORDER — WARFARIN - PHARMACIST DOSING INPATIENT
Freq: Every day | Status: DC
  Filled 2019-12-23: qty 1

## 2019-12-23 MED ORDER — WARFARIN SODIUM 5 MG PO TABS
5.0000 mg | ORAL_TABLET | Freq: Once | ORAL | Status: DC
  Filled 2019-12-23: qty 1

## 2019-12-23 MED ORDER — ACETAMINOPHEN 325 MG PO TABS
325.0000 mg | ORAL_TABLET | ORAL | Status: DC | PRN
  Administered 2019-12-23: 325 mg via ORAL
  Filled 2019-12-23: qty 1

## 2019-12-23 MED ORDER — GLIMEPIRIDE 2 MG PO TABS: 2.0000 mg | ORAL_TABLET | ORAL | Status: DC

## 2019-12-23 MED ORDER — METOPROLOL SUCCINATE ER 50 MG PO TB24
50.0000 mg | ORAL_TABLET | Freq: Every day | ORAL | Status: DC
  Administered 2019-12-23: 50 mg via ORAL

## 2019-12-23 NOTE — TOC Initial Note (Signed)
Transition of Care [facility]) - Initial/Assessment Note    Patient Details  Name: Stephanie Welch MRN: [id number] Date of Birth: 12-Aug-1927  Transition of Care [facility]) CM/SW Contact:    Marina Goodell Phone Number: [phone] 12/23/2019, 9:17 AM  Clinical Narrative:                  Patient presents at [facility]/ED due to fall.  Patient is experiencing right hip pain.  Patient currently lives at [facility].  This CSW contacted the SW admissions office at [facility] and left a voicemail.  PT consult has been requested by EDP.        Patient Goals and CMS Choice        Expected Discharge Plan and Services                                                Prior Living Arrangements/Services                       Activities of Daily Living      Permission Sought/Granted                  Emotional Assessment              Admission diagnosis:  hip pain  Patient Active Problem List   Diagnosis Date Noted  . AMS (altered mental status) 09/09/2019  . Closed bilateral fracture of pubic rami (HCC) 09/08/2019  . Diabetes mellitus without complication (HCC)   . Altered mental status   . Bilateral pubic rami fractures, closed, initial encounter (HCC)   . Atrial fibrillation, chronic (HCC)   . HFrEF (heart failure with reduced ejection fraction) (HCC)   . Femur fracture (HCC) 01/23/2016  . Hypothyroidism 01/23/2016  . HTN (hypertension) 01/23/2016  . Macular degeneration 01/23/2016   PCP:  Lauro Regulus, MD Pharmacy:   [address] [place], [place] - [address] [place] [address] Phone: [phone] Fax: [phone]  [facility] - [place], [place] - [address] [place] [address] Phone: [phone] Fax: [phone]     Social Determinants of Health (SDOH) Interventions    Readmission Risk Interventions No flowsheet data found.

## 2019-12-23 NOTE — ED Provider Notes (Signed)
Patient has been seen, cleared by PT/OT. She has been accepted to a SNF. Her pain has been controlled with tylenol as needed, will make sure this is written for her at her facility. Unfortunately, she cannot have NSAIDs due to being on coumadin. If pain is an issue at facility, would recommend being seen by an MD there ASAP. This was conveyed to SNF.   Shaune Pollack, MD 12/23/19 1600

## 2019-12-23 NOTE — ED Notes (Addendum)
Physical therapy worked with patient. Pt resting with eyes closed in room at this time.

## 2019-12-23 NOTE — ED Notes (Signed)
Updated Christy at [facility].

## 2019-12-23 NOTE — ED Notes (Signed)
Blue and pink top recollected. Extra purple and light green sent to lab as well.

## 2019-12-23 NOTE — Evaluation (Signed)
Physical Therapy Evaluation Patient Details Name: Stephanie Welch MRN: [id number] DOB: [birth year] Today's Date: 12/23/2019   History of Present Illness  86 y.o. female with past medical history significant for hypertension, diabetes, dementia, macular degeneration who fell off the toilet at her SNF, acute pelvic fx that is non-operative.  (here with very similar situation/fx ~3 months ago).  Clinical Impression  Pt pleasantly confused t/o the session, but never-the-less showed good effort and willingness to participate with mobility/PT.  She did c/o moderate R hip pain with movement but was able to bear weight through it (with expected UE use on walker) w/o buckling or excessive guarding.  She was able to take a few side and forward/backward steps but was hurting and lacking confidence enough to really do more.  Pt apparently is able to do in-room ambulation and some independent toileting, etc and will need more assist as well as in-house PT at discharge.     Follow Up Recommendations SNF    Equipment Recommendations  None recommended by PT    Recommendations for Other Services       Precautions / Restrictions Precautions Precautions: Fall Restrictions Weight Bearing Restrictions: No      Mobility  Bed Mobility Overal bed mobility: Needs Assistance Bed Mobility: Supine to Sit;Sit to Supine     Supine to sit: Min guard Sit to supine: Min assist   General bed mobility comments: Pt with some pain and extra effort to get herself to EOB but actually needed only very light assist with R LE.  Definite need of more assist to get LEs back into bed  Transfers Overall transfer level: Needs assistance Equipment used: Rolling walker (2 wheeled) Transfers: Sit to/from Stand Sit to Stand: Min assist         General transfer comment: Pt with some initial hesitance in trying to stand, but with light assist was able to do so.   Ambulation/Gait Ambulation/Gait assistance: Mod  assist Gait Distance (Feet): 5 Feet Assistive device: Rolling walker (2 wheeled)       General Gait Details: Initially c/o pain in R hip; showed good motivation to do some side stepping along EOB as well as a few forward and backward steps; too fearful and uncomfortable to make a longer bout of standing/walking tenable.  Stairs            Wheelchair Mobility    Modified Rankin (Stroke Patients Only)       Balance Overall balance assessment: Needs assistance Sitting-balance support: No upper extremity supported Sitting balance-Leahy Scale: Good     Standing balance support: Bilateral upper extremity supported Standing balance-Leahy Scale: Poor Standing balance comment: reliant on walker/UEs, hesitant to put full weight on R                             Pertinent Vitals/Pain Pain Assessment: Faces Faces Pain Scale: Hurts even more Pain Location: R hip with movement, does not increase significantly with WBing/standing    Home Living Family/patient expects to be discharged to:: Skilled nursing facility                 Additional Comments: Pt is long term resident at SNF/[facility]    Prior Function Level of Independence: Needs assistance         Comments: SNF resident, apparently with walker pt could do some minimal in-room ambulation to bathroom, closet, etc but did not walk the hallways.  Hand Dominance        Extremity/Trunk Assessment   Upper Extremity Assessment Upper Extremity Assessment: Generalized weakness    Lower Extremity Assessment Lower Extremity Assessment: Generalized weakness(pain with R hip flexion and abd)       Communication   Communication: No difficulties  Cognition Arousal/Alertness: Awake/alert Behavior During Therapy: Anxious Overall Cognitive Status: History of cognitive impairments - at baseline                                 General Comments: pt known to this PT, mental status appears  very similar to presentation 3 months ago      General Comments General comments (skin integrity, edema, etc.): Pt seen for very similar situation a few months ago, does not recall that admission    Exercises     Assessment/Plan    PT Assessment Patient needs continued PT services  PT Problem List Decreased strength;Decreased range of motion;Decreased activity tolerance;Decreased balance;Decreased mobility;Decreased cognition;Decreased knowledge of use of DME;Decreased safety awareness;Pain       PT Treatment Interventions DME instruction;Gait training;Functional mobility training;Therapeutic activities;Therapeutic exercise;Balance training;Cognitive remediation;Neuromuscular re-education;Patient/family education    PT Goals (Current goals can be found in the Care Plan section)  Acute Rehab PT Goals Patient Stated Goal: go back home PT Goal Formulation: With patient Time For Goal Achievement: 01/06/20 Potential to Achieve Goals: Good    Frequency Min 2X/week   Barriers to discharge        Co-evaluation               AM-PAC PT "6 Clicks" Mobility  Outcome Measure Help needed turning from your back to your side while in a flat bed without using bedrails?: A Little Help needed moving from lying on your back to sitting on the side of a flat bed without using bedrails?: A Little Help needed moving to and from a bed to a chair (including a wheelchair)?: A Lot Help needed standing up from a chair using your arms (e.g., wheelchair or bedside chair)?: A Little Help needed to walk in hospital room?: A Lot Help needed climbing 3-5 steps with a railing? : Total 6 Click Score: 14    End of Session Equipment Utilized During Treatment: Gait belt Activity Tolerance: Patient tolerated treatment well;Patient limited by pain Patient left: in bed;with call bell/phone within reach Nurse Communication: Mobility status PT Visit Diagnosis: Muscle weakness (generalized)  (M62.81);Difficulty in walking, not elsewhere classified (R26.2);Pain Pain - Right/Left: Right Pain - part of body: Hip    Time: 1443-1540 PT Time Calculation (min) (ACUTE ONLY): 25 min   Charges:   PT Evaluation $PT Eval Low Complexity: 1 Low          Malachi Pro, DPT 12/23/2019, 4:18 PM

## 2019-12-23 NOTE — Discharge Instructions (Addendum)
For pain:  Take Tylenol 650 mg every 4-6 hours as needed, or 1000 mg every 6 hours if using extra strength  Please encourage Stephanie Welch to move as much as possible and reposition when sitting to prevent pressure sores  Also, if pain is an issue, I'd recommend having an MD at the facility see her ASAP to put her on a stronger regimen

## 2019-12-23 NOTE — Consult Note (Signed)
ANTICOAGULATION CONSULT NOTE   Pharmacy Consult for Warfarin  Indication: atrial fibrillation  Allergies  Allergen Reactions  . Conjugated Estrogens     Other reaction(s): Unknown    Patient Measurements: Height: 5\' 7"  (170.2 cm) Weight: 80.3 kg (177 lb) IBW/kg (Calculated) : 61.6   Vital Signs: Temp: 98.2 F (36.8 C) (04/26 2210) Temp Source: Oral (04/26 2210) BP: 161/73 (04/27 0130) Pulse Rate: 84 (04/27 0130)  Labs: Recent Labs    12/22/19 2331 12/23/19 0007  HGB 14.1  --   HCT 45.4  --   PLT 208  --   APTT  --  46*  LABPROT  --  22.7*  INR  --  2.0*  CREATININE 1.33*  --     Estimated Creatinine Clearance: 30.1 mL/min (A) (by C-G formula based on SCr of 1.33 mg/dL (H)).   Medications:  Warfarin 5 mg daily  - last dose 4/26  @1652   Assessment: Pharmacy has been consulted for warfarin dosing in a 81 YOF with a PMH significant for atrial fibrillation, CHF, hypothyroidism, and dementia. Patient's baseline CBC is WNL.   Date INR  Inpatient Dose (mg) 4/26 2.0 ------------------------- 4/27 2.0  Goal of Therapy:  INR 2-3 Monitor platelets by anticoagulation protocol: Yes   Plan:  Will order warfarin 5 mg @ 1600 today (home dose). Will order INR daily until therapeutic time x 2. Will order a CBC every 3 days.  5/26 12/23/2019,7:59 AM

## 2019-12-23 NOTE — TOC Progression Note (Addendum)
Transition of Care [facility]) - Progression Note    Patient Details  Name: [name] MRN: [id number] Date of Birth: [birth year]  Transition of Care [facility]) CM/SW Contact  Marina Goodell Phone Number: [phone] 12/23/2019, 1:53 PM  Clinical Narrative:    Sherron Monday with Cala Bradford at [facility].  Pateint is already at their SNF facility and will not need a Fl2 when she is ready to discharge.  Cala Bradford stated they will the patient's new orders with discharge paperwork.          Expected Discharge Plan and Services                                                 Social Determinants of Health (SDOH) Interventions    Readmission Risk Interventions No flowsheet data found.

## 2019-12-23 NOTE — TOC Transition Note (Addendum)
Transition of Care [facility]) - CM/SW Discharge Note   Patient Details  Name: Stephanie Welch MRN: [id number] Date of Birth: 05/01/1927  Transition of Care [facility]) CM/SW Contact:  Marina Goodell Phone Number: [phone] 12/23/2019, 3:39 PM   Clinical Narrative:     Patient D/C to [facility].   Report # [id number], Room#345, ask for Tabitha. EDP and ED Staff notified.         Patient Goals and CMS Choice        Discharge Placement                       Discharge Plan and Services                                     Social Determinants of Health (SDOH) Interventions     Readmission Risk Interventions No flowsheet data found.

## 2019-12-23 NOTE — ED Notes (Signed)
This RN noticed pt cardiac leads came offline, came into room to find patient pulling off wires and stickers stating "these are just too sensitive for my skin, they're itching." No redness noted under cardiac leads or tape from IV. Pt states she doesn't want "all these wires". IV found on floor, pt removed. Site is clean, dry, intact with no significant bleeding. Fall alarm is turned on for this pt.

## 2019-12-23 NOTE — ED Notes (Signed)
[facility]  CALLED  FOR  TRANSPORT  TO  [facility]

## 2019-12-23 NOTE — ED Notes (Signed)
Pt assisted to eat half of biscuit and 4 oz apple juice. States she is full. Clean and dry and placed back into resting position as requested by patient.

## 2019-12-23 NOTE — ED Provider Notes (Signed)
[facility] Emergency Department Provider Note  ____________________________________________   First MD Initiated Contact with Patient 12/22/19 2305     (approximate)  I have reviewed the triage vital signs and the nursing notes.   HISTORY  Chief Complaint Fall    HPI Stephanie Welch is a 86 y.o. female with medical history as listed below who [facility] EMS [facility] after a fall.  Reportedly she fell at approximately 9 PM off of the toilet while reaching for the trash can.  She reportedly hit her head but the patient denies this.  [facility] had [facility] and because she is on warfarin they called 911.  However upon arrival to the emergency department she is reporting that her head feels fine but that she has right hip pain with any amount of movement or palpation.  She denies loss of consciousness, neck pain, chest pain, shortness of breath, nausea, vomiting, and dysuria.  Moving around makes the right hip pain worse and nothing in particular makes it better.  She has a prior right hip replacement and reportedly has had bilateral pubic rami fractures in the past but she is not sure about the details.   Onset was acute and symptoms are severe.        Past Medical History:  Diagnosis Date  . Coronary artery disease   . Diabetes mellitus without complication (HCC)   . Hypertension   . Macular degeneration   . Thyroid disease     Patient Active Problem List   Diagnosis Date Noted  . AMS (altered mental status) 09/09/2019  . Closed bilateral fracture of pubic rami (HCC) 09/08/2019  . Diabetes mellitus without complication (HCC)   . Altered mental status   . Bilateral pubic rami fractures, closed, initial encounter (HCC)   . Atrial fibrillation, chronic (HCC)   . HFrEF (heart failure with reduced ejection fraction) (HCC)   . Femur fracture  (HCC) 01/23/2016  . Hypothyroidism 01/23/2016  . HTN (hypertension) 01/23/2016  . Macular degeneration 01/23/2016    Past Surgical History:  Procedure Laterality Date  . ABDOMINAL HYSTERECTOMY    . FRACTURE SURGERY    . HIP ARTHROPLASTY Right 01/24/2016   Procedure: ARTHROPLASTY BIPOLAR HIP (HEMIARTHROPLASTY);  Surgeon: Christena FlakeJohn J Poggi, MD;  Location: [facility] ORS;  Service: Orthopedics;  Laterality: Right;    Prior to Admission medications   Medication Sig Start Date End Date Taking? Authorizing Provider  acetaminophen (TYLENOL) 325 MG tablet Take 325 mg by mouth every 4 (four) hours as needed for mild pain.   Yes [provider]  cetirizine (ZYRTEC) 10 MG tablet Take 10 mg by mouth daily. 08/20/19  Yes [provider]  cholecalciferol (VITAMIN D3) 25 MCG (1000 UNIT) tablet Take 1,000 Units by mouth daily.   Yes [provider]  citalopram (CELEXA) 10 MG tablet Take 10 mg by mouth daily. 12/23/15  Yes [provider]  COUMADIN 5 MG tablet Take 5 mg by mouth daily at 4 PM.    Yes [provider]  furosemide (LASIX) 20 MG tablet Take 20 mg by mouth daily. 12/23/15  Yes [provider]  glimepiride (AMARYL) 2 MG tablet Take 2 mg by mouth every morning. 12/23/15  Yes [provider]  levothyroxine (SYNTHROID, LEVOTHROID) 100 MCG tablet Take 100 mcg by mouth daily before breakfast. 01/13/16  Yes [provider]  metFORMIN (GLUCOPHAGE)  500 MG tablet Take 500 mg by mouth 2 (two) times daily. 12/23/15  Yes [provider]  metoprolol succinate (TOPROL-XL) 50 MG 24 hr tablet Take 150 mg by mouth daily with breakfast.  12/23/15  Yes [provider]  Multiple Vitamins-Minerals (OCUVITE-LUTEIN PO) Take 1 tablet by mouth every morning.   Yes [provider]    Allergies Conjugated estrogens  No family history on file.  Social History Social History   Tobacco Use  . Smoking status: Never Smoker  . Smokeless  tobacco: Never Used  Substance Use Topics  . Alcohol use: No  . Drug use: Not on file    Review of Systems Constitutional: No fever/chills Eyes: No visual changes. ENT: No sore throat. Cardiovascular: Denies chest pain. Respiratory: Denies shortness of breath. Gastrointestinal: No abdominal pain.  No nausea, no vomiting.  No diarrhea.  No constipation. Genitourinary: Negative for dysuria. Musculoskeletal: Right hip pain after fall.  Negative for neck pain.  Negative for back pain. Integumentary: Negative for rash. Neurological: Negative for headaches, focal weakness or numbness.   ____________________________________________   PHYSICAL EXAM:  VITAL SIGNS: ED Triage Vitals  Enc Vitals Group     BP 12/22/19 2210 (!) 156/90     Pulse Rate 12/22/19 2210 82     Resp 12/22/19 2210 18     Temp 12/22/19 2210 98.2 F (36.8 C)     Temp Source 12/22/19 2210 Oral     SpO2 12/22/19 2210 97 %     Weight 12/22/19 2214 80.3 kg (177 lb)     Height 12/22/19 2214 1.702 m (5\' 7" )     Head Circumference --      Peak Flow --      Pain Score --      Pain Loc --      Pain Edu? --      Excl. in GC? --     Constitutional: Alert and oriented.  Eyes: Conjunctivae are normal.  Head: Atraumatic. Nose: No congestion/rhinnorhea. Mouth/Throat: Patient is wearing a mask. Neck: No stridor.  No meningeal signs.   Cardiovascular: Normal rate, regular rhythm. Good peripheral circulation. Grossly normal heart sounds. Respiratory: Normal respiratory effort.  No retractions. Gastrointestinal: Soft and nontender. No distention.  Musculoskeletal: The patient has severe tenderness with palpation or passive or active movement of her right hip, more specifically the iliac crest.  No gross deformities of her legs, no shortening or external rotation of the affected limb.  No tenderness to palpation of the cervical spine, no pain or tenderness with flexion extension or rotation side to side of her head and neck.   No lower extremity tenderness nor edema. No gross deformities of extremities. Neurologic:  Normal speech and language. No gross focal neurologic deficits are appreciated.  Skin:  Skin is warm, dry and intact. Psychiatric: Mood and affect are normal. Speech and behavior are normal.  ____________________________________________   LABS (all labs ordered are listed, but only abnormal results are displayed)  Labs Reviewed  COMPREHENSIVE METABOLIC PANEL - Abnormal; Notable for the following components:      Result Value   Glucose, Bld 112 (*)    BUN 29 (*)    Creatinine, Ser 1.33 (*)    GFR calc non Af Amer 35 (*)    GFR calc Af Amer 40 (*)    All other components within normal limits  PROTIME-INR - Abnormal; Notable for the following components:   Prothrombin Time 22.7 (*)    INR 2.0 (*)  All other components within normal limits  APTT - Abnormal; Notable for the following components:   aPTT 46 (*)    All other components within normal limits  RESPIRATORY PANEL BY RT PCR (FLU A&B, COVID)  CBC WITH DIFFERENTIAL/PLATELET  TYPE AND SCREEN  TYPE AND SCREEN   ____________________________________________  EKG  ED ECG REPORT I, Loleta Rose, the attending physician, personally viewed and interpreted this ECG.  Date: 12/22/2019 EKG Time: 22: 09 Rate: 84 Rhythm: Atrial fibrillation QRS Axis: normal Intervals: Abnormal secondary to A. fib.  QTc 492 ms. ST/T Wave abnormalities: Non-specific ST segment / T-wave changes, but no clear evidence of acute ischemia. Narrative Interpretation: no definitive evidence of acute ischemia; does not meet STEMI criteria.   ED ECG REPORT I, Loleta Rose, the attending physician, personally viewed and interpreted this ECG.  Date: 12/22/2019 EKG Time: 22: 18 Rate: 65 Rhythm: Atrial fibrillation QRS Axis: normal Intervals: normal ST/T Wave abnormalities: Non-specific ST segment / T-wave changes, but no clear evidence of acute  ischemia. Narrative Interpretation: no definitive evidence of acute ischemia; does not meet STEMI criteria.   ____________________________________________  RADIOLOGY I, Loleta Rose, personally viewed and evaluated these images (plain radiographs) as part of my medical decision making, as well as reviewing the written report by the radiologist.  ED MD interpretation: No acute abnormalities on CT scans.  The patient has bilateral superior pubic rami fractures and bilateral inferior pubic rami fractures.  Official radiology report(s): CT Head Wo Contrast  Result Date: 12/22/2019 CLINICAL DATA:  Fall EXAM: CT HEAD WITHOUT CONTRAST TECHNIQUE: Contiguous axial images were obtained from the base of the skull through the vertex without intravenous contrast. COMPARISON:  September 08, 2019 FINDINGS: Brain: No evidence of acute territorial infarction, hemorrhage, hydrocephalus,extra-axial collection or mass lesion/mass effect. There is dilatation the ventricles and sulci consistent with age-related atrophy. Low-attenuation changes in the deep white matter consistent with small vessel ischemia. There is a prior area of encephalomalacia involving the left occipital lobe. Vascular: No hyperdense vessel or unexpected calcification. Skull: The skull is intact. No fracture or focal lesion identified. Sinuses/Orbits: The visualized paranasal sinuses and mastoid air cells are clear. The orbits and globes intact. Other: None Cervical spine: Alignment: There is straightening of the normal cervical lordosis. Skull base and vertebrae: Visualized skull base is intact. No atlanto-occipital dissociation. The vertebral body heights are well maintained. No fracture or pathologic osseous lesion seen. Soft tissues and spinal canal: The visualized paraspinal soft tissues are unremarkable. No prevertebral soft tissue swelling is seen. The spinal canal is grossly unremarkable, no large epidural collection or significant canal  narrowing. Disc levels: Multilevel cervical spine spondylosis is seen with disc height loss, disc osteophyte complex and uncovertebral osteophytes most notable at C5-C6 and C6-C7 with moderate to severe neural foraminal narrowing and mild central canal stenosis. Upper chest: The lung apices are clear. Thoracic inlet is within normal limits. Carotid artery calcifications are seen. Other: None IMPRESSION: No acute intracranial abnormality. Findings consistent with age related atrophy and chronic small vessel ischemia Prior left occipital lobe infarct No acute fracture or malalignment of the spine. Electronically Signed   By: Jonna Clark M.D.   On: 12/22/2019 23:35   CT Cervical Spine Wo Contrast  Result Date: 12/22/2019 CLINICAL DATA:  Fall EXAM: CT HEAD WITHOUT CONTRAST TECHNIQUE: Contiguous axial images were obtained from the base of the skull through the vertex without intravenous contrast. COMPARISON:  September 08, 2019 FINDINGS: Brain: No evidence of acute territorial infarction, hemorrhage, hydrocephalus,extra-axial  collection or mass lesion/mass effect. There is dilatation the ventricles and sulci consistent with age-related atrophy. Low-attenuation changes in the deep white matter consistent with small vessel ischemia. There is a prior area of encephalomalacia involving the left occipital lobe. Vascular: No hyperdense vessel or unexpected calcification. Skull: The skull is intact. No fracture or focal lesion identified. Sinuses/Orbits: The visualized paranasal sinuses and mastoid air cells are clear. The orbits and globes intact. Other: None Cervical spine: Alignment: There is straightening of the normal cervical lordosis. Skull base and vertebrae: Visualized skull base is intact. No atlanto-occipital dissociation. The vertebral body heights are well maintained. No fracture or pathologic osseous lesion seen. Soft tissues and spinal canal: The visualized paraspinal soft tissues are unremarkable. No  prevertebral soft tissue swelling is seen. The spinal canal is grossly unremarkable, no large epidural collection or significant canal narrowing. Disc levels: Multilevel cervical spine spondylosis is seen with disc height loss, disc osteophyte complex and uncovertebral osteophytes most notable at C5-C6 and C6-C7 with moderate to severe neural foraminal narrowing and mild central canal stenosis. Upper chest: The lung apices are clear. Thoracic inlet is within normal limits. Carotid artery calcifications are seen. Other: None IMPRESSION: No acute intracranial abnormality. Findings consistent with age related atrophy and chronic small vessel ischemia Prior left occipital lobe infarct No acute fracture or malalignment of the spine. Electronically Signed   By: Prudencio Pair M.D.   On: 12/22/2019 23:35   DG Hip Unilat W or Wo Pelvis 2-3 Views Right  Result Date: 12/22/2019 CLINICAL DATA:  Status post fall. EXAM: DG HIP (WITH OR WITHOUT PELVIS) 2-3V RIGHT COMPARISON:  Jan 24, 2016 FINDINGS: Acute fracture deformities are seen along the lateral aspects of the bilateral superior pubic rami. These extend along the medial aspect of the right acetabulum and left acetabulum. Nondisplaced fractures of the bilateral inferior pubic rami are noted. A total right hip replacement is seen. There is no evidence of surrounding lucency to suggest the presence of hardware loosening or infection. There is no evidence of dislocation. Soft tissue structures are unremarkable. IMPRESSION: 1. Acute fractures of the bilateral superior pubic rami and bilateral inferior pubic rami. 2. Right hip replacement. Electronically Signed   By: Virgina Norfolk M.D.   On: 12/22/2019 23:23    ____________________________________________   PROCEDURES   Procedure(s) performed (including Critical Care):  .1-3 Lead EKG Interpretation Performed by: Hinda Kehr, MD Authorized by: Hinda Kehr, MD     Interpretation: normal     ECG rate:  91    ECG rate assessment: normal     Rhythm: sinus rhythm     Ectopy: none     Conduction: normal       ____________________________________________   INITIAL IMPRESSION / MDM / ASSESSMENT AND PLAN / ED COURSE  As part of my medical decision making, I reviewed the following data within the [place] notes reviewed and incorporated, Labs reviewed , EKG interpreted , Old chart reviewed, Radiograph reviewed , Discussed with orthopedics (Dr. Rudene Christians)  and reviewed Notes from prior ED visits   Differential diagnosis includes, but is not limited to, hip/femur fracture, pelvic fractures, acute infection.  The patient's pelvis/hip x-rays demonstrated bilateral superior and inferior pubic rami fractures but no femur fracture.  These are typically nonoperative.  She is in no pain unless she tries to bear weight.  She has already had a skilled nursing facility but it is unclear at this time whether or not she will be appropriate to  go back or whether she will need placement at a rehabilitation facility.  Her lab work is reassuring with a normal CBC, essentially normal comprehensive metabolic panel, and a therapeutic INR at 2.0 on warfarin.  COVID-19 PCR test is negative.  I will contact orthopedics to verify my plan but I do not believe she meets admission criteria to the hospital and will need PT/OT evaluation and social work assistance getting placed in an appropriate facility.  The patient is awake, alert, oriented, and makes her own decisions, and she agrees with the current plan.  I have ordered her home medication after pharmacy reconcile her med list.  The patient is on the cardiac monitor to evaluate for evidence of arrhythmia and/or significant heart rate changes.     Clinical Course as of Dec 23 542  Tue Dec 23, 2019  0045 Paging Dr. Rosita Kea to verify plan.   [CF]  0121 Discussed case by phone with Dr. Rosita Kea who confirmed this is a non-operative case and that the patient  will need rehab vs SNF placement (she is already at a SNF).  Will proceed with plan.   [CF]    Clinical Course User Index [CF] Loleta Rose, MD     ____________________________________________  FINAL CLINICAL IMPRESSION(S) / ED DIAGNOSES  Final diagnoses:  Bilateral pubic rami fractures, closed, initial encounter (HCC)     MEDICATIONS GIVEN DURING THIS VISIT:  Medications  acetaminophen (TYLENOL) tablet 325 mg (has no administration in time range)  loratadine (CLARITIN) tablet 10 mg (has no administration in time range)  cholecalciferol (VITAMIN D3) tablet 1,000 Units (has no administration in time range)  citalopram (CELEXA) tablet 10 mg (has no administration in time range)  warfarin (COUMADIN) tablet 5 mg (has no administration in time range)  furosemide (LASIX) tablet 20 mg (has no administration in time range)  levothyroxine (SYNTHROID) tablet 100 mcg (has no administration in time range)  metFORMIN (GLUCOPHAGE) tablet 500 mg (has no administration in time range)  metoprolol succinate (TOPROL-XL) 24 hr tablet 150 mg (has no administration in time range)  multivitamin-lutein (OCUVITE-LUTEIN) capsule 1 capsule (has no administration in time range)     ED Discharge Orders    None      *Please note:  SHEWANDA SHARPE was evaluated in Emergency Department on 12/23/2019 for the symptoms described in the history of present illness. She was evaluated in the context of the global COVID-19 pandemic, which necessitated consideration that the patient might be at risk for infection with the SARS-CoV-2 virus that causes COVID-19. Institutional protocols and algorithms that pertain to the evaluation of patients at risk for COVID-19 are in a state of rapid change based on information released by regulatory bodies including the CDC and federal and state organizations. These policies and algorithms were followed during the patient's care in the ED.  Some ED evaluations and interventions may be  delayed as a result of limited staffing during the pandemic.*  Note:  This document was prepared using Dragon voice recognition software and may include unintentional dictation errors.   Loleta Rose, MD 12/23/19 [phone]

## 2019-12-23 NOTE — ED Notes (Addendum)
Report to [place] at [facility]. Awaiting EMS transport back to facility.  [facility] confirms that they are going to give patient her prescribed coumadin when she arrives back to facility tonight.

## 2019-12-29 ENCOUNTER — Encounter
Admission: RE | Admit: 2019-12-29 | Discharge: 2019-12-29 | Disposition: A | Discharge status: Home / Self Care | Payer: Medicare PPO | Source: Ambulatory Visit | Attending: Internal Medicine | Admitting: Internal Medicine

## 2019-12-31 ENCOUNTER — Other Ambulatory Visit
Admission: RE | Admit: 2019-12-31 | Discharge: 2019-12-31 | Disposition: A | Discharge status: Home / Self Care | Payer: Medicare PPO | Source: Ambulatory Visit | Attending: Internal Medicine | Admitting: Internal Medicine

## 2019-12-31 DIAGNOSIS — I4891 Unspecified atrial fibrillation: Secondary | ICD-10-CM | POA: Diagnosis present

## 2019-12-31 LAB — PROTIME-INR
INR: 3.2 — ABNORMAL HIGH (ref 0.8–1.2)
Prothrombin Time: 31.6 seconds — ABNORMAL HIGH (ref 11.4–15.2)

## 2020-01-28 ENCOUNTER — Other Ambulatory Visit
Admission: RE | Admit: 2020-01-28 | Discharge: 2020-01-28 | Disposition: A | Discharge status: Home / Self Care | Payer: Medicare PPO | Source: Ambulatory Visit | Attending: Internal Medicine | Admitting: Internal Medicine

## 2020-01-28 DIAGNOSIS — I4891 Unspecified atrial fibrillation: Secondary | ICD-10-CM | POA: Insufficient documentation

## 2020-01-28 LAB — PROTIME-INR
INR: 3.7 — ABNORMAL HIGH (ref 0.8–1.2)
Prothrombin Time: 35.3 seconds — ABNORMAL HIGH (ref 11.4–15.2)

## 2020-02-11 ENCOUNTER — Encounter
Admission: RE | Admit: 2020-02-11 | Discharge: 2020-02-11 | Disposition: A | Discharge status: Home / Self Care | Payer: Medicare PPO | Source: Ambulatory Visit | Attending: Internal Medicine | Admitting: Internal Medicine

## 2020-02-12 ENCOUNTER — Other Ambulatory Visit
Admission: RE | Admit: 2020-02-12 | Discharge: 2020-02-12 | Disposition: A | Discharge status: Home / Self Care | Payer: Medicare PPO | Source: Ambulatory Visit | Attending: Internal Medicine | Admitting: Internal Medicine

## 2020-02-12 DIAGNOSIS — I4891 Unspecified atrial fibrillation: Secondary | ICD-10-CM | POA: Diagnosis present

## 2020-02-12 LAB — PROTIME-INR
INR: 2.4 — ABNORMAL HIGH (ref 0.8–1.2)
Prothrombin Time: 25.2 seconds — ABNORMAL HIGH (ref 11.4–15.2)

## 2020-03-11 ENCOUNTER — Other Ambulatory Visit
Admission: RE | Admit: 2020-03-11 | Discharge: 2020-03-11 | Disposition: A | Discharge status: Home / Self Care | Payer: Medicare PPO | Source: Ambulatory Visit | Attending: Internal Medicine | Admitting: Internal Medicine

## 2020-03-11 DIAGNOSIS — I4891 Unspecified atrial fibrillation: Secondary | ICD-10-CM | POA: Diagnosis present

## 2020-03-11 LAB — PROTIME-INR
INR: 2.6 — ABNORMAL HIGH (ref 0.8–1.2)
Prothrombin Time: 21.6 seconds — ABNORMAL HIGH (ref 11.4–15.2)

## 2020-04-12 ENCOUNTER — Other Ambulatory Visit
Admission: RE | Admit: 2020-04-12 | Discharge: 2020-04-12 | Disposition: A | Discharge status: Home / Self Care | Payer: Medicare PPO | Source: Ambulatory Visit | Attending: Internal Medicine | Admitting: Internal Medicine

## 2020-04-12 DIAGNOSIS — I4891 Unspecified atrial fibrillation: Secondary | ICD-10-CM | POA: Diagnosis present

## 2020-04-12 DIAGNOSIS — Z7901 Long term (current) use of anticoagulants: Secondary | ICD-10-CM | POA: Insufficient documentation

## 2020-04-12 LAB — PROTIME-INR
INR: 1.6 — ABNORMAL HIGH (ref 0.8–1.2)
Prothrombin Time: 18.4 seconds — ABNORMAL HIGH (ref 11.4–15.2)

## 2020-04-26 ENCOUNTER — Other Ambulatory Visit
Admission: RE | Admit: 2020-04-26 | Discharge: 2020-04-26 | Disposition: A | Discharge status: Home / Self Care | Payer: Medicare PPO | Source: Ambulatory Visit | Attending: Internal Medicine | Admitting: Internal Medicine

## 2020-04-26 DIAGNOSIS — N390 Urinary tract infection, site not specified: Secondary | ICD-10-CM | POA: Insufficient documentation

## 2020-04-26 DIAGNOSIS — Z7901 Long term (current) use of anticoagulants: Secondary | ICD-10-CM | POA: Diagnosis not present

## 2020-04-26 LAB — URINALYSIS, COMPLETE (UACMP) WITH MICROSCOPIC
Bilirubin Urine: NEGATIVE
Glucose, UA: NEGATIVE mg/dL
Ketones, ur: NEGATIVE mg/dL
Leukocytes,Ua: NEGATIVE
Nitrite: NEGATIVE
Protein, ur: NEGATIVE mg/dL
Specific Gravity, Urine: 1.016 (ref 1.005–1.030)
pH: 5 (ref 5.0–8.0)

## 2020-04-27 LAB — URINE CULTURE

## 2020-05-04 ENCOUNTER — Encounter
Admission: RE | Admit: 2020-05-04 | Discharge: 2020-05-04 | Disposition: A | Discharge status: Home / Self Care | Payer: Medicare PPO | Source: Ambulatory Visit | Attending: Internal Medicine | Admitting: Internal Medicine

## 2020-05-13 ENCOUNTER — Other Ambulatory Visit
Admission: RE | Admit: 2020-05-13 | Discharge: 2020-05-13 | Disposition: A | Discharge status: Home / Self Care | Payer: Medicare PPO | Source: Ambulatory Visit | Attending: Internal Medicine | Admitting: Internal Medicine

## 2020-05-13 DIAGNOSIS — I4891 Unspecified atrial fibrillation: Secondary | ICD-10-CM | POA: Diagnosis present

## 2020-05-13 LAB — PROTIME-INR
INR: 2.7 — ABNORMAL HIGH (ref 0.8–1.2)
Prothrombin Time: 27.8 seconds — ABNORMAL HIGH (ref 11.4–15.2)

## 2020-06-11 ENCOUNTER — Other Ambulatory Visit
Admission: RE | Admit: 2020-06-11 | Discharge: 2020-06-11 | Disposition: A | Discharge status: Home / Self Care | Payer: Medicare PPO | Source: Ambulatory Visit | Attending: Internal Medicine | Admitting: Internal Medicine

## 2020-06-11 DIAGNOSIS — Z5181 Encounter for therapeutic drug level monitoring: Secondary | ICD-10-CM | POA: Insufficient documentation

## 2020-06-11 LAB — PROTIME-INR
INR: 3.8 — ABNORMAL HIGH (ref 0.8–1.2)
Prothrombin Time: 36.1 seconds — ABNORMAL HIGH (ref 11.4–15.2)

## 2020-06-25 ENCOUNTER — Other Ambulatory Visit
Admission: RE | Admit: 2020-06-25 | Discharge: 2020-06-25 | Disposition: A | Discharge status: Home / Self Care | Payer: Medicare PPO | Source: Skilled Nursing Facility | Attending: Internal Medicine | Admitting: Internal Medicine

## 2020-06-25 DIAGNOSIS — I4891 Unspecified atrial fibrillation: Secondary | ICD-10-CM | POA: Insufficient documentation

## 2020-06-25 LAB — PROTIME-INR
INR: 3.8 — ABNORMAL HIGH (ref 0.8–1.2)
Prothrombin Time: 36.4 seconds — ABNORMAL HIGH (ref 11.4–15.2)

## 2020-07-08 ENCOUNTER — Other Ambulatory Visit
Admission: RE | Admit: 2020-07-08 | Discharge: 2020-07-08 | Disposition: A | Discharge status: Home / Self Care | Payer: Medicare PPO | Source: Ambulatory Visit | Attending: Internal Medicine | Admitting: Internal Medicine

## 2020-07-08 DIAGNOSIS — I4891 Unspecified atrial fibrillation: Secondary | ICD-10-CM | POA: Diagnosis present

## 2020-07-08 LAB — PROTIME-INR
INR: 3 — ABNORMAL HIGH (ref 0.8–1.2)
Prothrombin Time: 30.2 seconds — ABNORMAL HIGH (ref 11.4–15.2)

## 2020-09-14 ENCOUNTER — Other Ambulatory Visit: Payer: Self-pay

## 2020-09-14 ENCOUNTER — Encounter: Payer: Self-pay | Admitting: Emergency Medicine

## 2020-09-14 ENCOUNTER — Emergency Department: Payer: Medicare PPO

## 2020-09-14 ENCOUNTER — Emergency Department
Admission: EM | Admit: 2020-09-14 | Discharge: 2020-09-14 | Disposition: A | Discharge status: Home / Self Care | Payer: Medicare PPO | Attending: Emergency Medicine | Admitting: Emergency Medicine

## 2020-09-14 DIAGNOSIS — I6782 Cerebral ischemia: Secondary | ICD-10-CM | POA: Diagnosis not present

## 2020-09-14 DIAGNOSIS — Z7901 Long term (current) use of anticoagulants: Secondary | ICD-10-CM | POA: Insufficient documentation

## 2020-09-14 DIAGNOSIS — W01198A Fall on same level from slipping, tripping and stumbling with subsequent striking against other object, initial encounter: Secondary | ICD-10-CM | POA: Insufficient documentation

## 2020-09-14 DIAGNOSIS — I119 Hypertensive heart disease without heart failure: Secondary | ICD-10-CM | POA: Insufficient documentation

## 2020-09-14 DIAGNOSIS — S8991XA Unspecified injury of right lower leg, initial encounter: Secondary | ICD-10-CM | POA: Diagnosis present

## 2020-09-14 DIAGNOSIS — E119 Type 2 diabetes mellitus without complications: Secondary | ICD-10-CM | POA: Diagnosis not present

## 2020-09-14 DIAGNOSIS — I2510 Atherosclerotic heart disease of native coronary artery without angina pectoris: Secondary | ICD-10-CM | POA: Insufficient documentation

## 2020-09-14 DIAGNOSIS — S8001XA Contusion of right knee, initial encounter: Secondary | ICD-10-CM | POA: Diagnosis not present

## 2020-09-14 DIAGNOSIS — I4811 Longstanding persistent atrial fibrillation: Secondary | ICD-10-CM | POA: Diagnosis not present

## 2020-09-14 DIAGNOSIS — R791 Abnormal coagulation profile: Secondary | ICD-10-CM | POA: Diagnosis not present

## 2020-09-14 DIAGNOSIS — E039 Hypothyroidism, unspecified: Secondary | ICD-10-CM | POA: Insufficient documentation

## 2020-09-14 DIAGNOSIS — Z96641 Presence of right artificial hip joint: Secondary | ICD-10-CM | POA: Diagnosis not present

## 2020-09-14 DIAGNOSIS — Z7984 Long term (current) use of oral hypoglycemic drugs: Secondary | ICD-10-CM | POA: Insufficient documentation

## 2020-09-14 DIAGNOSIS — Z79899 Other long term (current) drug therapy: Secondary | ICD-10-CM | POA: Diagnosis not present

## 2020-09-14 DIAGNOSIS — W19XXXA Unspecified fall, initial encounter: Secondary | ICD-10-CM

## 2020-09-14 DIAGNOSIS — S0990XA Unspecified injury of head, initial encounter: Secondary | ICD-10-CM | POA: Diagnosis present

## 2020-09-14 LAB — CBC WITH DIFFERENTIAL/PLATELET
Abs Immature Granulocytes: 0.03 10*3/uL (ref 0.00–0.07)
Basophils Absolute: 0.1 10*3/uL (ref 0.0–0.1)
Basophils Relative: 1 %
Eosinophils Absolute: 0.1 10*3/uL (ref 0.0–0.5)
Eosinophils Relative: 2 %
HCT: 41 % (ref 36.0–46.0)
Hemoglobin: 13.1 g/dL (ref 12.0–15.0)
Immature Granulocytes: 0 %
Lymphocytes Relative: 21 %
Lymphs Abs: 1.4 10*3/uL (ref 0.7–4.0)
MCH: 30.6 pg (ref 26.0–34.0)
MCHC: 32 g/dL (ref 30.0–36.0)
MCV: 95.8 fL (ref 80.0–100.0)
Monocytes Absolute: 0.8 10*3/uL (ref 0.1–1.0)
Monocytes Relative: 12 %
Neutro Abs: 4.2 10*3/uL (ref 1.7–7.7)
Neutrophils Relative %: 64 %
Platelets: 222 10*3/uL (ref 150–400)
RBC: 4.28 MIL/uL (ref 3.87–5.11)
RDW: 13.8 % (ref 11.5–15.5)
WBC: 6.7 10*3/uL (ref 4.0–10.5)
nRBC: 0 % (ref 0.0–0.2)

## 2020-09-14 LAB — BASIC METABOLIC PANEL
Anion gap: 11 (ref 5–15)
BUN: 19 mg/dL (ref 8–23)
CO2: 25 mmol/L (ref 22–32)
Calcium: 9.2 mg/dL (ref 8.9–10.3)
Chloride: 102 mmol/L (ref 98–111)
Creatinine, Ser: 0.84 mg/dL (ref 0.44–1.00)
GFR, Estimated: >60 mL/min (ref >60)
Glucose, Bld: 109 mg/dL — ABNORMAL HIGH (ref 70–99)
Potassium: 4.2 mmol/L (ref 3.5–5.1)
Sodium: 138 mmol/L (ref 135–145)

## 2020-09-14 LAB — PROTIME-INR
INR: 1.1 (ref 0.8–1.2)
Prothrombin Time: 13.5 seconds (ref 11.4–15.2)

## 2020-09-14 NOTE — ED Notes (Signed)
Pt in the hallway , from [facility] ( DNR) , awake / alert x1.   Complaining of right knee pain , EMS reported that it was a witnessed fall, pt does not recall the fall today.

## 2020-09-14 NOTE — ED Provider Notes (Signed)
[facility] Emergency Department Provider Note  ____________________________________________   Event Date/Time   First MD Initiated Contact with Patient 09/14/20 1238     (approximate)  I have reviewed the triage vital signs and the nursing notes.   HISTORY  Chief Complaint Fall   HPI Stephanie Welch is a 87 y.o. female with past medical history of CAD, DM, HTN, thyroid disease and A. fib anticoagulated on Coumadin and rate controlled on metoprolol who presents from nursing facility after a witnessed ground-level fall.  Patient states she lost her balance and hit her head and right knee.  No LOC.  Denies any headache but endorses some pain in her right knee.  She denies any other pain including in her chest, abdomen, back, neck, or other extremities.  States she fall couple weeks ago and hit her left knee but does not have any pain in her left knee at the moment.  She denies any other acute sick symptoms including fevers, chills, cough, nausea, vomiting, diarrhea, dysuria, rash, or other acute pain.         Past Medical History:  Diagnosis Date  . Coronary artery disease   . Diabetes mellitus without complication (HCC)   . Hypertension   . Macular degeneration   . Thyroid disease     Patient Active Problem List   Diagnosis Date Noted  . AMS (altered mental status) 09/09/2019  . Closed bilateral fracture of pubic rami (HCC) 09/08/2019  . Diabetes mellitus without complication (HCC)   . Altered mental status   . Bilateral pubic rami fractures, closed, initial encounter (HCC)   . Atrial fibrillation, chronic (HCC)   . HFrEF (heart failure with reduced ejection fraction) (HCC)   . Femur fracture (HCC) 01/23/2016  . Hypothyroidism 01/23/2016  . HTN (hypertension) 01/23/2016  . Macular degeneration 01/23/2016    Past Surgical History:  Procedure Laterality Date  . ABDOMINAL HYSTERECTOMY    . FRACTURE SURGERY    . HIP ARTHROPLASTY Right 01/24/2016    Procedure: ARTHROPLASTY BIPOLAR HIP (HEMIARTHROPLASTY);  Surgeon: Christena FlakeJohn J Poggi, MD;  Location: [facility] ORS;  Service: Orthopedics;  Laterality: Right;    Prior to Admission medications   Medication Sig Start Date End Date Taking? Authorizing Provider  acetaminophen (TYLENOL) 325 MG tablet Take 2 tablets (650 mg total) by mouth every 6 (six) hours as needed for moderate pain. 12/23/19 12/22/20  Shaune PollackIsaacs, Cameron, MD  cetirizine (ZYRTEC) 10 MG tablet Take 10 mg by mouth daily. 08/20/19   [provider]  cholecalciferol (VITAMIN D3) 25 MCG (1000 UNIT) tablet Take 1,000 Units by mouth daily.    [provider]  citalopram (CELEXA) 10 MG tablet Take 10 mg by mouth daily. 12/23/15   [provider]  COUMADIN 5 MG tablet Take 5 mg by mouth daily at 4 PM.     [provider]  furosemide (LASIX) 20 MG tablet Take 20 mg by mouth daily. 12/23/15   [provider]  glimepiride (AMARYL) 2 MG tablet Take 2 mg by mouth every morning. 12/23/15   [provider]  levothyroxine (SYNTHROID, LEVOTHROID) 100 MCG tablet Take 100 mcg by mouth daily before breakfast. 01/13/16   [provider]  metFORMIN (GLUCOPHAGE) 500 MG tablet Take 500 mg by mouth 2 (two) times daily. 12/23/15   [provider]  metoprolol succinate (TOPROL-XL) 50 MG 24 hr tablet Take 50 mg by mouth daily with breakfast.  12/23/15   [provider]  Multiple Vitamins-Minerals (OCUVITE-LUTEIN  PO) Take 1 tablet by mouth every morning.    [provider]    Allergies Conjugated estrogens  No family history on file.  Social History Social History   Tobacco Use  . Smoking status: Never Smoker  . Smokeless tobacco: Never Used  Substance Use Topics  . Alcohol use: No    Review of Systems  Review of Systems  Constitutional: Negative for chills and fever.  HENT: Negative for sore throat.   Eyes: Negative for pain.  Respiratory: Negative for cough and stridor.    Cardiovascular: Negative for chest pain.  Gastrointestinal: Negative for vomiting.  Genitourinary: Negative for dysuria.  Musculoskeletal: Positive for joint pain ( R knee). Negative for myalgias.  Skin: Negative for rash.  Neurological: Negative for seizures, loss of consciousness and headaches.  Psychiatric/Behavioral: Negative for suicidal ideas.  All other systems reviewed and are negative.     ____________________________________________   PHYSICAL EXAM:  VITAL SIGNS: ED Triage Vitals  Enc Vitals Group     BP 09/14/20 1033 123/71     Pulse Rate 09/14/20 1033 82     Resp 09/14/20 1033 14     Temp 09/14/20 1033 98.2 F (36.8 C)     Temp Source 09/14/20 1033 Oral     SpO2 09/14/20 1033 94 %     Weight 09/14/20 0756 176 lb 5.9 oz (80 kg)     Height 09/14/20 0756 5\' 7"  (1.702 m)     Head Circumference --      Peak Flow --      Pain Score 09/14/20 0755 0     Pain Loc --      Pain Edu? --      Excl. in GC? --    Vitals:   09/14/20 1033  BP: 123/71  Pulse: 82  Resp: 14  Temp: 98.2 F (36.8 C)  SpO2: 94%   Physical Exam Vitals and nursing note reviewed.  Constitutional:      General: She is not in acute distress.    Appearance: She is well-developed and well-nourished.  HENT:     Head: Normocephalic and atraumatic.     Right Ear: External ear normal.     Left Ear: External ear normal.     Nose: Nose normal.  Eyes:     Conjunctiva/sclera: Conjunctivae normal.  Cardiovascular:     Rate and Rhythm: Normal rate. Rhythm irregular.     Pulses: Normal pulses.     Heart sounds: No murmur heard.   Pulmonary:     Effort: Pulmonary effort is normal. No respiratory distress.     Breath sounds: Normal breath sounds.  Abdominal:     Palpations: Abdomen is soft.     Tenderness: There is no abdominal tenderness.  Musculoskeletal:        General: No edema.     Cervical back: Neck supple.     Right lower leg: No edema.     Left lower leg: No edema.  Skin:     General: Skin is warm and dry.     Capillary Refill: Capillary refill takes less than 2 seconds.  Neurological:     General: No focal deficit present.     Mental Status: She is alert and oriented to person, place, and time.  Psychiatric:        Mood and Affect: Mood and affect and mood normal.     Patient has some very slight tenderness palpation of the anterior right knee.  No significant effusion  or deformity.  2+ bilateral radial pulses.  Cranial nerves II to XII grossly intact.  There is tenderness to upper deformities over the C4/T4/L-spine.  Patient otherwise has full strength and sensation throughout her bilateral upper and lower extremities.  ____________________________________________   LABS (all labs ordered are listed, but only abnormal results are displayed)  Labs Reviewed  BASIC METABOLIC PANEL - Abnormal; Notable for the following components:      Result Value   Glucose, Bld 109 (*)    All other components within normal limits  CBC WITH DIFFERENTIAL/PLATELET  PROTIME-INR   ____________________________________________  EKG  A. fib with a ventricular rate of 89, normal axis, unremarkable intervals, no evidence of acute ischemia or other significant arrhythmia. ____________________________________________  RADIOLOGY  ED MD interpretation: CT head and C-spine showed no evidence of acute fracture or intracranial hemorrhage.   Official radiology report(s): DG Chest 2 View  Result Date: 09/14/2020 CLINICAL DATA:  Recent fall, trauma EXAM: CHEST - 2 VIEW COMPARISON:  09/08/2019 FINDINGS: Small right pleural effusion with minimal right base atelectasis/consolidation. Stable cardiomegaly without CHF or edema. Similar background chronic interstitial changes. No pneumothorax. Trachea midline. Aorta atherosclerotic and tortuous. Bones are osteopenic and degenerative changes noted of the spine. IMPRESSION: Small right pleural effusion and associated right basilar  atelectasis/consolidation. Cardiomegaly without CHF Aortic Atherosclerosis (ICD10-I70.0). Electronically Signed   By: Judie PetitM.  Shick M.D.   On: 09/14/2020 13:17   CT Head Wo Contrast  Result Date: 09/14/2020 CLINICAL DATA:  Trauma.  Fall. EXAM: CT HEAD WITHOUT CONTRAST CT CERVICAL SPINE WITHOUT CONTRAST TECHNIQUE: Multidetector CT imaging of the head and cervical spine was performed following the standard protocol without intravenous contrast. Multiplanar CT image reconstructions of the cervical spine were also generated. COMPARISON:  December 22, 2019. FINDINGS: CT HEAD FINDINGS Brain: No evidence of acute large vascular territory infarction, acute hemorrhage, hydrocephalus, extra-axial collection or mass lesion/mass effect. Similar appearance of remote left occipital infarct and moderate chronic microvascular ischemic change. Vascular: Calcific atherosclerosis. Skull: No acute fracture. Sinuses/Orbits: Small amount of frothy secretions in the left sphenoid sinus. Otherwise, sinuses are largely clear. Unremarkable orbits. Other: No mastoid effusions. CT CERVICAL SPINE FINDINGS Alignment: No substantial subluxation. Similar alignment in comparison to prior. Skull base and vertebrae: No evidence of acute fracture. Vertebral body heights are maintained. Osteopenia. Soft tissues and spinal canal: No prevertebral fluid or swelling. No visible canal hematoma. Disc levels: Similar multilevel degenerative disc disease, greatest at C5-C6 and C6-C7 where there is disc height loss, endplate sclerosis, and posterior disc osteophyte complexes. Facet arthropathy is greatest on the right at C3-C4. Upper chest: Negative. IMPRESSION: 1. No evidence of acute intracranial abnormality. 2. Moderate chronic microvascular ischemic change and remote left occipital infarct. 3. No evidence of acute fracture or traumatic malalignment the cervical spine. Electronically Signed   By: Feliberto HartsFrederick S Jones MD   On: 09/14/2020 08:29   CT Cervical Spine  Wo Contrast  Result Date: 09/14/2020 CLINICAL DATA:  Trauma.  Fall. EXAM: CT HEAD WITHOUT CONTRAST CT CERVICAL SPINE WITHOUT CONTRAST TECHNIQUE: Multidetector CT imaging of the head and cervical spine was performed following the standard protocol without intravenous contrast. Multiplanar CT image reconstructions of the cervical spine were also generated. COMPARISON:  December 22, 2019. FINDINGS: CT HEAD FINDINGS Brain: No evidence of acute large vascular territory infarction, acute hemorrhage, hydrocephalus, extra-axial collection or mass lesion/mass effect. Similar appearance of remote left occipital infarct and moderate chronic microvascular ischemic change. Vascular: Calcific atherosclerosis. Skull: No acute fracture. Sinuses/Orbits: Small amount  of frothy secretions in the left sphenoid sinus. Otherwise, sinuses are largely clear. Unremarkable orbits. Other: No mastoid effusions. CT CERVICAL SPINE FINDINGS Alignment: No substantial subluxation. Similar alignment in comparison to prior. Skull base and vertebrae: No evidence of acute fracture. Vertebral body heights are maintained. Osteopenia. Soft tissues and spinal canal: No prevertebral fluid or swelling. No visible canal hematoma. Disc levels: Similar multilevel degenerative disc disease, greatest at C5-C6 and C6-C7 where there is disc height loss, endplate sclerosis, and posterior disc osteophyte complexes. Facet arthropathy is greatest on the right at C3-C4. Upper chest: Negative. IMPRESSION: 1. No evidence of acute intracranial abnormality. 2. Moderate chronic microvascular ischemic change and remote left occipital infarct. 3. No evidence of acute fracture or traumatic malalignment the cervical spine. Electronically Signed   By: Feliberto Harts MD   On: 09/14/2020 08:29   DG Pelvis Portable  Result Date: 09/14/2020 CLINICAL DATA:  Fall, trauma, right hip arthroplasty EXAM: PORTABLE PELVIS 1-2 VIEWS COMPARISON:  09/08/2019 FINDINGS: Bones are  osteopenic. Degenerative changes of the lumbosacral spine, SI joints, and left hip. Remote right hip arthroplasty, incompletely imaged inferiorly. Healed rami fractures bilaterally. No definite acute osseous finding by plain radiography. IMPRESSION: Degenerative changes and osteopenia as above. No acute finding by plain radiography. Electronically Signed   By: Judie Petit.  Shick M.D.   On: 09/14/2020 13:19   DG Knee Complete 4 Views Right  Result Date: 09/14/2020 CLINICAL DATA:  Fall, right knee pain EXAM: RIGHT KNEE - COMPLETE 4+ VIEW COMPARISON:  None. FINDINGS: Advanced right knee tricompartmental osteoarthritis with joint space loss, sclerosis and bony spurring of all 3 compartments. Bones are osteopenic. No acute displaced fracture, malalignment or effusion. Peripheral atherosclerosis noted. IMPRESSION: Advanced right knee tricompartmental osteoarthritis. Osteopenia No acute finding by plain radiography Electronically Signed   By: Judie Petit.  Shick M.D.   On: 09/14/2020 13:20    ____________________________________________   PROCEDURES  Procedure(s) performed (including Critical Care):  Procedures   ____________________________________________   INITIAL IMPRESSION / ASSESSMENT AND PLAN / ED COURSE      Patient presents with above-stated history exam for assessment after a fall that occurred at her nursing facility earlier today.  Patient reportedly hit her head but did not have LOC is only complaining of some right knee pain on on arrival.  She is afebrile and hemodynamically stable.  She does not have any acute neurovascular deficits in extremities on exam although some mild tenderness of anterior aspect of the right knee which is otherwise unremarkable on exam.  CT head and C-spine showed no evidence of fracture dislocation or intracranial hemorrhage.  Chest x-ray and pelvic x-ray showed no evidence of fracture dislocation pneumothorax or other clear acute process.  Patient does have some  cardiomegaly and aortic atherosclerosis is very small right pleural effusion without any other acute process.  No evidence of pneumonia.  X-ray of the right knee is unremarkable for any evidence of fracture or dislocation.  Impression is mild contusion.  CBCunremarkable.  INR is subtherapeutic at 1.1.  BMP shows no significant derangements and EKG shows known A. fib with no evidence of ischemia or other significant arrhythmia.  Given stable vitals with otherwise reassuring exam work-up without any other evidence of significant injury or other clear acute immediately life-threatening process I would patient is safe for discharge with plan for close outpatient PCP follow-up.  Discharged stable condition.  Strict return precautions provided in writing.  Patient provided in writing instructions to follow-up with PCP regarding possible continuation of Coumadin  and subtherapeutic INR noted today.     ____________________________________________   FINAL CLINICAL IMPRESSION(S) / ED DIAGNOSES  Final diagnoses:  Fall, initial encounter  Contusion of right knee, initial encounter  Longstanding persistent atrial fibrillation (HCC)  Subtherapeutic international normalized ratio (INR)    Medications - No data to display   ED Discharge Orders    None       Note:  This document was prepared using Dragon voice recognition software and may include unintentional dictation errors.   Gilles Chiquito, MD 09/14/20 [phone]

## 2020-09-14 NOTE — ED Notes (Signed)
[facility]  FOR TRANSPORT  [facility]  [place]

## 2020-09-14 NOTE — ED Triage Notes (Signed)
Presents via EMS from [place]   S/p fall  Lost her balance  Hitting her head  No LOC  Per EMS the fall was witnessed

## 2022-02-08 ENCOUNTER — Non-Acute Institutional Stay: Payer: Medicare PPO | Admitting: Student

## 2022-02-10 ENCOUNTER — Non-Acute Institutional Stay: Payer: Medicare PPO | Admitting: Student

## 2022-02-10 DIAGNOSIS — R630 Anorexia: Secondary | ICD-10-CM

## 2022-02-10 DIAGNOSIS — Z515 Encounter for palliative care: Secondary | ICD-10-CM

## 2022-02-10 DIAGNOSIS — G309 Alzheimer's disease, unspecified: Secondary | ICD-10-CM

## 2022-02-10 NOTE — Progress Notes (Unsigned)
  [facility] Palliative Care Consult Note Telephone: [phone]  Fax: [phone]   Date of encounter: 02/10/22 4:46 PM PATIENT NAME: Stephanie Welch [address] Stephanie Welch [place] [address]   [phone] (home)  DOB: 11/15/1926 MRN: [id number] PRIMARY CARE PROVIDER:    Anderson, Marshall W, MD,  [address] [facility] [place] [address] [phone]  REFERRING PROVIDER:   Anderson, Marshall W, MD [address] [facility] [place],  [place] [address] [phone]  RESPONSIBLE PARTY:    Contact Information     Name Relation Home Work Mobile   Baskette,Judith J Friend [phone]    Baskette, Jerry Friend [phone]          I met face to face with patient in the facility. Palliative Care was asked to follow this patient by consultation request of  Anderson, Marshall W, MD to address advance care planning and complex medical decision making. This is the initial visit.   Message left for HCPOA.                                    ASSESSMENT AND PLAN / RECOMMENDATIONS:   Advance Care Planning/Goals of Care: Goals include to maximize quality of life and symptom management. Patient/health care surrogate gave his/her permission to discuss.Our advance care planning conversation included a discussion about:    The value and importance of advance care planning  Experiences with loved ones who have been seriously ill or have died  Exploration of personal, cultural or spiritual beliefs that might influence medical decisions  Exploration of goals of care in the event of a sudden injury or illness  CODE STATUS: DNR  Education provided on Palliative Medicine. Will monitor for functional and cognitive changes and declines. Will refer to hospice when she meets eligibility requirements.   Symptom Management/Plan:  Alzheimer's dementia-patient is sleeping more, she has also had some weight loss. She is  requiring more cueing/reminders. Staff to continue providing supportive care. Will continue to monitor for functional and cognitive declines. Will refer for hospice evaluation should she continue to decline.   Weight loss, protein calorie malnutrition- patient's weight is 166 pounds; she is down 6 pounds in the last month. Patient is sleeping late into the day, often missing a meal. Glucerna added BID; offer snacks and fluids throughout the day; encourage eating foods she enjoys. Routine weights per facility.   Follow up Palliative Care Visit: Palliative care will continue to follow for complex medical decision making, advance care planning, and clarification of goals. Return in 4 weeks or prn.   This visit was coded based on medical decision making (MDM).  PPS: 40%  HOSPICE ELIGIBILITY/DIAGNOSIS: TBD  Chief Complaint: Palliative Medicine initial consult.   HISTORY OF PRESENT ILLNESS:  Stephanie Welch is a 88 y.o. year old female  with Alzheimer's dementia, heart failure, hypertension, diabetes, hypothyroidism, macular degeneration, hx of femur fracture.   Patient resides at [facility]. She is requiring more assistance with adl's; she still ambulates short distances with walker. She is sleeping more throughout the day; she will sleep into the afternoon. She is occasionally awake during the night. Her appetite is declining. She has lost weight in the past month; down 6 pounds. Patient has also started refusing medications at times.    Patient is received sitting in chair. She had just gotten   up for the day and assisted to chair. 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 She denies pain, shortness of breath, nausea. She states she just doesn't have much of an appetite. HPI and ROS primarily obtained from staff due to her dementia.   History obtained from review of EMR, discussion with primary team, and  interview with family, facility staff/caregiver and/or Ms. Stevens.  I reviewed available labs, medications, imaging, studies and related documents from the EMR.  Records reviewed and summarized above.   Physical Exam: Pulse 68, resp 16, sats 93% on room air Constitutional: NAD General: frail appearing, chronically ill appearing EYES: anicteric sclera, drooping of both lower eyelids, no discharge, impaired vision ENMT: slight hard of hearing, oral mucous membranes moist CV: S1S2, RRR, no LE edema Pulmonary: LCTA, no increased work of breathing, no cough, room air Abdomen: normo-active BS + 4 quadrants, soft and non tender, no ascites GU: deferred MSK: moves all extremities, ambulatory Skin: warm and dry, no rashes or wounds on visible skin Neuro: + generalized weakness, A & O to person Psych: non-anxious affect, pleasant Hem/lymph/immuno: no widespread bruising CURRENT PROBLEM LIST:  Patient Active Problem List   Diagnosis Date Noted   AMS (altered mental status) 09/09/2019   Closed bilateral fracture of pubic rami (HCC) 09/08/2019   Diabetes mellitus without complication (HCC)    Altered mental status    Bilateral pubic rami fractures, closed, initial encounter (HCC)    Atrial fibrillation, chronic (HCC)    HFrEF (heart failure with reduced ejection fraction) (HCC)    Femur fracture (HCC) 01/23/2016   Hypothyroidism 01/23/2016   HTN (hypertension) 01/23/2016   Macular degeneration 01/23/2016   PAST MEDICAL HISTORY:  Active Ambulatory Problems    Diagnosis Date Noted   Femur fracture (HCC) 01/23/2016   Hypothyroidism 01/23/2016   HTN (hypertension) 01/23/2016   Macular degeneration 01/23/2016   Diabetes mellitus without complication (HCC)    Altered mental status    Bilateral pubic rami fractures, closed, initial encounter (HCC)    Atrial fibrillation, chronic (HCC)    HFrEF (heart failure with reduced ejection fraction) (HCC)    Closed bilateral fracture of pubic rami  (HCC) 09/08/2019   AMS (altered mental status) 09/09/2019   Resolved Ambulatory Problems    Diagnosis Date Noted   No Resolved Ambulatory Problems   Past Medical History:  Diagnosis Date   Coronary artery disease    Hypertension    Thyroid disease    SOCIAL HX:  Social History   Tobacco Use   Smoking status: Never   Smokeless tobacco: Never  Substance Use Topics   Alcohol use: No   FAMILY HX: No family history on file.    ALLERGIES:  Allergies  Allergen Reactions   Conjugated Estrogens     Other reaction(s): Unknown     PERTINENT MEDICATIONS:  Outpatient Encounter Medications as of 02/10/2022  Medication Sig   cetirizine (ZYRTEC) 10 MG tablet Take 10 mg by mouth daily.   cholecalciferol (VITAMIN D3) 25 MCG (1000 UNIT) tablet Take 1,000 Units by mouth daily.   citalopram (CELEXA) 10 MG tablet Take 10 mg by mouth daily.   COUMADIN 5 MG tablet Take 5 mg by mouth daily at 4 PM.    furosemide (LASIX) 20 MG tablet Take 20 mg by mouth daily.   glimepiride (AMARYL) 2 MG tablet Take 2 mg by mouth every morning.   levothyroxine (  SYNTHROID, LEVOTHROID) 100 MCG tablet Take 100 mcg by mouth daily before breakfast.   metFORMIN (GLUCOPHAGE) 500 MG tablet Take 500 mg by mouth 2 (two) times daily.   metoprolol succinate (TOPROL-XL) 50 MG 24 hr tablet Take 50 mg by mouth daily with breakfast.    Multiple Vitamins-Minerals (OCUVITE-LUTEIN PO) Take 1 tablet by mouth every morning.   No facility-administered encounter medications on file as of 02/10/2022.   Thank you for the opportunity to participate in the care of Ms. Cortner.  The palliative care team will continue to follow. Please call our office at [phone] if we can be of additional assistance.   Ezekiel Slocumb, NP   COVID-19 PATIENT SCREENING TOOL Asked and negative response unless otherwise noted:  Have you had symptoms of covid, tested positive or been in contact with someone with symptoms/positive test in the past 5-10  days? No

## 2022-04-05 ENCOUNTER — Non-Acute Institutional Stay: Payer: Medicare PPO | Admitting: Student

## 2022-04-05 DIAGNOSIS — F0280 Dementia in other diseases classified elsewhere without behavioral disturbance: Secondary | ICD-10-CM

## 2022-04-05 DIAGNOSIS — E46 Unspecified protein-calorie malnutrition: Secondary | ICD-10-CM

## 2022-04-05 DIAGNOSIS — Z515 Encounter for palliative care: Secondary | ICD-10-CM

## 2022-04-05 DIAGNOSIS — H9193 Unspecified hearing loss, bilateral: Secondary | ICD-10-CM

## 2022-04-05 NOTE — Progress Notes (Signed)
[facility] Consult Note Telephone: [phone]  Fax: [phone]    Date of encounter: 04/05/22 12:58 PM PATIENT NAME: Stephanie Welch [place] [address]   [phone] (home)  DOB: Dec 27, 1926 MRN: [id number] PRIMARY CARE PROVIDER:    Kirk Ruths, MD,  [address] [place] [address] [phone]  REFERRING PROVIDER:   Kirk Ruths, MD [facility] [place],   [address] [phone]  RESPONSIBLE PARTY:    Contact Information     Name Relation Home Work [place] [phone]    Cleora Fleet [phone]          I met face to face with patient and HCPOA in the facility. Palliative Care was asked to follow this patient by consultation request of  Kirk Ruths, MD to address advance care planning and complex medical decision making. This is a follow up visit.                                   ASSESSMENT AND PLAN / RECOMMENDATIONS:   Advance Care Planning/Goals of Care: Goals include to maximize quality of life and symptom management. Patient/health care surrogate gave his/her permission to discuss. CODE STATUS: DNR  Education provided on palliative medicine.  Will continue to provide supportive care, symptom management as needed.  Will monitor for functional and cognitive declines.  Symptom Management/Plan:  Alzheimer's dementia-patient has been stable. Staff to continue providing cueing/reminders. Will monitor for falls/safety, functional and cognitive declines. Encourage staff to have patient come out of her room to engage with others.   Protein calorie malnutrition-patient's weight in June 166, 163 in July; most recently back up to 167 pounds. Intake is fair; breakfast is her best meal. Continue Glucerna BID, offer foods patient enjoys. Routine weights per facility.    Difficulty hearing-patient with impaired hearing at baseline, although reports worsening recently. Will start debrox 5 drops to bilateral ears QHS x 3, then flush with warm water on 4th morning.   Follow up Palliative Care Visit: Palliative care will continue to follow for complex medical decision making, advance care planning, and clarification of goals. Return in 6 weeks or prn.   This visit was coded based on medical decision making (MDM).  PPS: 40%  HOSPICE ELIGIBILITY/DIAGNOSIS: TBD  Chief Complaint: Palliative medicine follow-up visit.  HISTORY OF PRESENT ILLNESS:  Stephanie Welch is a 88 y.o. year old female  with Alzheimer's dementia, heart failure, cardiomyopathy, atrial fibrillation, hypertension, diabetes, hypothyroidism, macular degeneration, hx of femur fracture.    Patient resides at [facility].  Patient record reports doing well. She denies pain, endorses occasional shortness of breath with exertion. No LE edema. She sleeps well at night and continues to nap throughout the day.  Patient's appetite has been fair; she has actually gained a couple of 4 pounds in the past month.  Her weight is back up to 167 pounds.  Patient uses walker for ambulation.  No recent falls reported. Healthcare POA Bethena Roys expresses patient spending a lot of time in her room and would like for her to get out of her room more.  No recent infections, ED visits or hospitalizations.  History obtained from review of EMR, discussion with primary team, and interview with family, facility staff/caregiver and/or Ms.  Brailsford.  I reviewed available labs, medications, imaging, studies and related documents from the EMR.  Records reviewed and summarized above.   ROS  A 10 point review of systems is negative, except for the pertinent positives and negatives detailed per the HPI.  Physical Exam: Weight: 167.5 pounds Pulse 62-irregular, resp 18, b/p 120/78 Constitutional: NAD General: frail appearing   EYES: anicteric sclera, lids intact, no discharge  ENMT: hard of hearing, oral mucous membranes moist, dentition intact CV: S1S2, no LE edema, cyanosis to bilateral feet  Pulmonary: LCTA, no increased work of breathing, no cough, room air Abdomen: normo-active BS + 4 quadrants, soft and non tender, no ascites GU: deferred MSK: moves all extremities, ambulatory Skin: warm and dry, no rashes or wounds on visible skin Neuro: + generalized weakness, A & O to person, place, familiars Psych: non-anxious affect, pleasant Hem/lymph/immuno: no widespread bruising   Thank you for the opportunity to participate in the care of Ms. Holderman.  The palliative care team will continue to follow. Please call our office at [phone] if we can be of additional assistance.   Ezekiel Slocumb, NP   COVID-19 PATIENT SCREENING TOOL Asked and negative response unless otherwise noted:   Have you had symptoms of covid, tested positive or been in contact with someone with symptoms/positive test in the past 5-10 days? No

## 2022-05-18 ENCOUNTER — Non-Acute Institutional Stay: Payer: Medicare PPO | Admitting: Student

## 2022-05-18 DIAGNOSIS — Z515 Encounter for palliative care: Secondary | ICD-10-CM

## 2022-05-18 DIAGNOSIS — F0280 Dementia in other diseases classified elsewhere without behavioral disturbance: Secondary | ICD-10-CM

## 2022-05-18 DIAGNOSIS — E441 Mild protein-calorie malnutrition: Secondary | ICD-10-CM

## 2022-05-18 NOTE — Progress Notes (Signed)
[facility] Consult Note Telephone: [phone]  Fax: [phone]    Date of encounter: 05/18/22 7:49 PM PATIENT NAME: Stephanie Welch [id number]   [phone] (home)  DOB: 04-01-1927 MRN: [id number] PRIMARY CARE PROVIDER:    Kirk Ruths, MD,  [address] [place] [address] [phone]  REFERRING PROVIDER:   Kirk Ruths, MD [facility] [place],  [place] [address] [phone]  RESPONSIBLE PARTY:    Contact Information     Name Relation Home Work [place] [phone]    Cleora Fleet [phone]          I met face to face with patient in the facility. Palliative Care was asked to follow this patient by consultation request of  Kirk Ruths, MD to address advance care planning and complex medical decision making. This is a follow up visit.                                   ASSESSMENT AND PLAN / RECOMMENDATIONS:   Advance Care Planning/Goals of Care: Goals include to maximize quality of life and symptom management. Patient/health care surrogate gave his/her permission to discuss. CODE STATUS: DNR  Symptom Management/Plan:  Alzheimer's dementia-patient reports doing welll Staff to continue providing cueing/reminders. Will monitor for falls/safety, functional and cognitive declines. Encourage staff to have patient come out of her room to engage with others.   Protein calorie malnutrition-patient's weight has been steady; fair appetite. Currently 168.9 pounds. Continue routine weights per facility.   Follow up Palliative Care Visit: Palliative care will continue to follow for complex medical decision making, advance care planning, and clarification of goals. Return in 6-8 weeks or prn.  This visit was coded based on medical decision making (MDM).  PPS:  40%  HOSPICE ELIGIBILITY/DIAGNOSIS: TBD  Chief Complaint: Palliative Medicine follow up visit.   HISTORY OF PRESENT ILLNESS:  Stephanie Welch is a 88 y.o. year old female  with Alzheimer's dementia, heart failure, cardiomyopathy, atrial fibrillation, hypertension, diabetes, hypothyroidism, macular degeneration, hx of femur fracture.    Patient resides at [facility]. Patient received in her room. She has pleasant affect. She denies pain, shortness of breath at rest. She does report occasional shortness of breath with exertion. No edema. She endorses a fair appetite. She uses walker for ambulation. No recent falls reported. No recent infections, ED visits or hospitalizations.   History obtained from review of EMR, discussion with primary team, and interview with family, facility staff/caregiver and/or Ms. Thane.  I reviewed available labs, medications, imaging, studies and related documents from the EMR.  Records reviewed and summarized above.   ROS  A 10 point review of systems is negative, except for the pertinent positives and negatives detailed per the HPI.   Physical Exam: Weight: 168.9 Pulse 76, resp 16, b/p 120/68, sats 94% on room air Constitutional: NAD General: frail appearing EYES: anicteric sclera, lids intact, no discharge  ENMT: slight hard of hearing, oral mucous membranes moist CV: S1S2, RRR, no LE edema Pulmonary: LCTA, no increased work of breathing, no cough, room air Abdomen: normo-active BS + 4 quadrants, soft and non tender GU: deferred MSK: moves all extremities, ambulatory with walker Skin: warm and dry, no rashes or wounds on visible skin  Neuro: +generalized weakness, A & O x 2, familiars Psych: non-anxious affect, pleasant Hem/lymph/immuno: no widespread bruising   Thank you for the opportunity to participate in the care of Ms. Marinaro.  The palliative care team will continue to follow. Please call our office at [phone] if we can be of  additional assistance.   Ezekiel Slocumb, NP   COVID-19 PATIENT SCREENING TOOL Asked and negative response unless otherwise noted:   Have you had symptoms of covid, tested positive or been in contact with someone with symptoms/positive test in the past 5-10 days? No

## 2022-08-15 ENCOUNTER — Non-Acute Institutional Stay: Payer: Medicare PPO | Admitting: Nurse Practitioner

## 2022-08-15 DIAGNOSIS — Z515 Encounter for palliative care: Secondary | ICD-10-CM

## 2022-08-15 DIAGNOSIS — G309 Alzheimer's disease, unspecified: Secondary | ICD-10-CM

## 2022-08-15 DIAGNOSIS — E46 Unspecified protein-calorie malnutrition: Secondary | ICD-10-CM

## 2022-08-16 ENCOUNTER — Encounter: Payer: Self-pay | Admitting: Nurse Practitioner

## 2022-08-16 NOTE — Progress Notes (Addendum)
[facility] Consult Note Telephone: [phone]  Fax: [phone]    Date of encounter: 08/16/22 8:17 AM PATIENT NAME: Stephanie Welch Stephanie Welch [address]   [phone] (home)  DOB: [birth year] MRN: [id number] PRIMARY CARE PROVIDER:   [place] [facility] [place], [place],  Stephanie Welch [address] [phone]  RESPONSIBLE PARTY:    Contact Information     Name Relation Home Work Stephanie Welch [phone]    Stephanie Welch         I met face to face with patient in the facility. Palliative Care was asked to follow this patient by consultation request of  Stephanie Ruths, MD to address advance care planning and complex medical decision making. This is a follow up visit.                                  ASSESSMENT AND PLAN / RECOMMENDATIONS:  Symptom Management/Plan: 1. Advance Care Planning;  DNR 2. Goals of Care: Goals include to maximize quality of life and symptom management. Our advance care planning conversation included a discussion about:    The value and importance of advance care planning  Exploration of personal, cultural or spiritual beliefs that might influence medical decisions  Exploration of goals of care in the event of a sudden injury or illness  Identification and preparation of a healthcare agent  Review and updating or creation of an advance directive document. 3. Palliative care encounter; Palliative care encounter; Palliative medicine team will continue to support patient, patient's family, and medical team. Visit consisted of counseling and education dealing with the complex and emotionally intense issues of symptom management and palliative care in the setting of serious and potentially life-threatening illness  4. Alzheimer's dementia-progressive, ongoing support, encourage self  independence as able, supportive role, medications, medical goals reviewed.    5. Protein calorie malnutrition-stable; reviewed weights, continue to monitor weights, meal%. Will follow with 10% weight loss will further discuss possible option of hospice 07/28/2022 169.8 lbs   Follow up Palliative Care Visit: Palliative care will continue to follow for complex medical decision making, advance care planning, and clarification of goals. Return in 4-8 weeks or prn.  I spent 45 minutes providing this consultation. More than 50% of the time in this consultation was spent in counseling and care coordination.  PPS: 40%  Chief Complaint: Follow up palliative consult for complex medical decision making, address goals, manage ongoing symptoms   HISTORY OF PRESENT ILLNESS:  Stephanie Welch is a 88 y.o. year old female  with Alzheimer's dementia, heart failure, cardiomyopathy, atrial fibrillation, hypertension, diabetes, hypothyroidism, macular degeneration, hx of femur fracture. Stephanie Welch currently resides at Stephanie [facility], [facility]. Stephanie Welch does require assistance with transfers, bathing, dressing, toileting. Stephanie Welch requires assistance for tray set up, able to feed self, appetite has been stable per staff. No recent Welch, wounds, hospitalizations, infections. At present Stephanie Welch is sitting in the chair in her room, she does ambulate with a walker. Stephanie Welch and I talked about purpose of pc visit, ros, functional ability, daily routine, appetite. Stephanie Welch endorses no Stephanie changes or concerns. Explained role pc in [place]. Medical goals reviewed with medications, no Stephanie medications recommended today. Supportive visit.   History obtained from review of EMR, discussion  with primary team, and interview with family, facility staff/caregiver and/or Stephanie Welch.  I reviewed available labs, medications, imaging, studies and related documents from the EMR.  Records reviewed and summarized above.    ROS   A 10  point review of systems is negative, except for the pertinent positives and negatives detailed per the HPI.   Physical Exam: Constitutional: NAD General: frail appearing, debiliated pleasant female ENMT: oral mucous membranes moist CV: no LE edema Pulmonary: LCTA, no increased work of breathing, no cough, room air Abdomen: soft and non tender MSK: ambulatory with walker Skin: warm and dry Neuro: +generalized weakness, A & O x 2, familiars Psych: non-anxious affect, pleasant Thank you for the opportunity to participate in the care of Stephanie. Welch. Please call our office at [phone] if we can be of additional assistance.   Johm Pfannenstiel Ihor Gully, NP

## 2022-09-13 ENCOUNTER — Encounter: Payer: Self-pay | Admitting: Nurse Practitioner

## 2022-09-13 ENCOUNTER — Non-Acute Institutional Stay: Payer: Medicare PPO | Admitting: Nurse Practitioner

## 2022-09-13 DIAGNOSIS — F0280 Dementia in other diseases classified elsewhere without behavioral disturbance: Secondary | ICD-10-CM

## 2022-09-13 DIAGNOSIS — Z515 Encounter for palliative care: Secondary | ICD-10-CM

## 2022-09-13 DIAGNOSIS — E46 Unspecified protein-calorie malnutrition: Secondary | ICD-10-CM

## 2022-09-13 NOTE — Progress Notes (Addendum)
[facility] Consult Note Telephone: [phone]  Fax: [phone]    Date of encounter: 09/13/22 7:29 PM PATIENT NAME: Stephanie Welch [place] [address]   [phone] (home)  DOB: 1927/07/15 MRN: [id number] PRIMARY CARE PROVIDER:    Kirk Ruths, MD,  [facility]:    Contact Information     Name Relation Home Work Mobile   Stephanie Welch [phone]    Stephanie Welch [phone]        I met face to face with patient in facility. Palliative Care was asked to follow this patient by consultation request of  Stephanie Ruths, MD/[facility] to address advance care planning and complex medical decision making. This is a follow up visit.                                  ASSESSMENT AND PLAN / RECOMMENDATIONS:  Symptom Management/Plan: 1. Advance Care Planning;  DNR 2. Goals of Care: Goals include to maximize quality of life and symptom management. Our advance care planning conversation included a discussion about:    The value and importance of advance care planning  Exploration of personal, cultural or spiritual beliefs that might influence medical decisions  Exploration of goals of care in the event of a sudden injury or illness  Identification and preparation of a healthcare agent  Review and updating or creation of an advance directive document. 3. Palliative care encounter; Palliative care encounter; Palliative medicine team will continue to support patient, patient's family, and medical team. Visit consisted of counseling and education dealing with the complex and emotionally intense issues of symptom management and palliative care in the setting of serious and potentially life-threatening illness   4. Alzheimer's dementia-progressive, ongoing support, encourage self independence as able, supportive role, medications, medical  goals reviewed.    5. Protein calorie malnutrition-slight loss; reviewed weights, continue to monitor weights, meal%. Will follow with 10% weight loss will further discuss possible option of hospice 07/28/2022 weight 169.8 lbs 08/28/2022 weight 167 lbs 2.8 lbs loss/4 weeks Follow up Palliative Care Visit: Palliative care will continue to follow for complex medical decision making, advance care planning, and clarification of goals. Return 4 to 8 weeks or prn.  I spent 45 minutes providing this consultation. More than 50% of the time in this consultation was spent in counseling and care coordination. PPS: 50% Chief Complaint: Follow up palliative consult for complex medical decision making, address goals, manage ongoing symptoms  HISTORY OF PRESENT ILLNESS:  Stephanie Welch is a 89 y.o. year old female  with multiple medical problems including Alzheimer's dementia, heart failure, cardiomyopathy, atrial fibrillation, hypertension, diabetes, hypothyroidism, macular degeneration, hx of femur fracture. Ms Stephanie Welch currently resides at [place], [place]. Ms Stephanie Welch does require assistance with transfers, bathing, dressing, toileting. Ms Stephanie Welch requires assistance for tray set up, able to feed self, appetite has been stable per staff. No recent falls, wounds, hospitalizations, infections. Ms Stephanie Welch does ambulate with a walker. At present Ms Stephanie Welch is lying in bed, sleeping. Ms Stephanie Welch awoke to verbal cues. We talked about how she has been feeling, ros, functional abilities. Talked about no recent falls. We talked about daily routine, sleep patterns. Ms Stephanie Welch was tired during pc visit, talked about sleep hygiene. Medical goals, medications, poc reviewed. No new recommendations today, will continue  to monitor/follow weights, appetite, functional abilities, cognitive, chronic disease progression  I attempted to contact Stephanie Welch, home phone disconnected; cell phone listed no answer.   History obtained from  review of EMR, discussion with primary team, and interview with family, facility staff/caregiver and/or Ms. Stephanie Welch.  I reviewed available labs, medications, imaging, studies and related documents from the EMR.  Records reviewed and summarized above.   Physical Exam: Constitutional: NAD General: frail appearing, elderly female ENMT: oral mucous membranes moist Cardio: RRR Pulm: Breath sounds clear, decreased bases Skin: warm and dry, no rashes or wounds on visible skin Neuro:  no generalized weakness,  no cognitive impairment Psych: non-anxious affect, A and O x 3  Thank you for the opportunity to participate in the care of Ms. Stephanie Welch. Please call our office at [phone] if we can be of additional assistance.   Stephanie Welch Ihor Gully, NP

## 2022-10-11 ENCOUNTER — Encounter: Payer: Self-pay | Admitting: Nurse Practitioner

## 2022-10-11 ENCOUNTER — Non-Acute Institutional Stay: Payer: Medicare PPO | Admitting: Nurse Practitioner

## 2022-10-11 DIAGNOSIS — F0280 Dementia in other diseases classified elsewhere without behavioral disturbance: Secondary | ICD-10-CM

## 2022-10-11 DIAGNOSIS — E46 Unspecified protein-calorie malnutrition: Secondary | ICD-10-CM

## 2022-10-11 DIAGNOSIS — Z515 Encounter for palliative care: Secondary | ICD-10-CM

## 2022-10-11 NOTE — Progress Notes (Signed)
Stephanie Welch Consult Note Telephone: [phone]  Fax: [phone]    Date of encounter: 10/11/22 3:15 PM PATIENT NAME: Stephanie Welch Stephanie Welch [address]   [phone] (home)  DOB: 07-Aug-1927 MRN: [id number] PRIMARY CARE PROVIDER:    Kirk Ruths, MD,  [facility] [place]:    Contact Information     Name Relation Home Work Mobile   Stephanie Welch [phone]    Stephanie Welch [phone]        I met face to face with patient in facility. Palliative Care was asked to follow this patient by consultation request of  Kirk Ruths, MD/[facility] to address advance care planning and complex medical decision making. This is a follow up visit.                                  ASSESSMENT AND PLAN / RECOMMENDATIONS:  Symptom Management/Plan: 1. Advance Care Planning;  DNR 2. Goals of Care: Goals include to maximize quality of life and symptom management. Our advance care planning conversation included a discussion about:    The value and importance of advance care planning  Exploration of personal, cultural or spiritual beliefs that might influence medical decisions  Exploration of goals of care in the event of a sudden injury or illness  Identification and preparation of a healthcare agent  Review and updating or creation of an advance directive document. 3. Palliative care encounter; Palliative care encounter; Palliative medicine team will continue to support patient, patient's family, and medical team. Visit consisted of counseling and education dealing with the complex and emotionally intense issues of symptom management and palliative care in the setting of serious and potentially life-threatening illness   4. Alzheimer's dementia-progressive, ongoing support, encourage self independence as able, supportive role, medications, medical  goals reviewed.    5. Protein calorie malnutrition-slight loss; improved, remains stable; reviewed weights, continue to monitor weights, meal%. Discussed nutrition, supplements, favorite foods with Stephanie Welch, encouraged to eat.  07/28/2022 weight 169.8 lbs 08/28/2022 weight 167 lbs 09/28/2022 weight 169.8 lbs Follow up Palliative Care Visit: Palliative care will continue to follow for complex medical decision making, advance care planning, and clarification of goals. Return 4 to 8 weeks or prn.   I spent 46 minutes providing this consultation starting at 12:30 pm. More than 50% of the time in this consultation was spent in counseling and care coordination. PPS: 50% Chief Complaint: Follow up palliative consult for complex medical decision making, address goals, manage ongoing symptoms   HISTORY OF PRESENT ILLNESS:  [name] is a 89 y.o. year old female  with multiple medical problems including Alzheimer's dementia, heart failure, cardiomyopathy, atrial fibrillation, hypertension, diabetes, hypothyroidism, macular degeneration, hx of femur fracture. Stephanie Welch currently resides at [place], [place]. Stephanie Welch does require assistance with transfers, bathing, dressing, toileting. Stephanie Welch requires assistance for tray set up, able to feed self, appetite has been stable per staff. No recent falls, wounds, hospitalizations, infections. Stephanie Welch does ambulate with a walker. Purpose of today PC f/u visit further discussion monitor trends of appetite, weights, monitor for functional, cognitive decline with chronic disease progression, assess any active symptoms, supportive role. At present Stephanie Welch is sitting in the chair in her room. We talked about how she has been feeling, ros, functional abilities. Talked  about no recent falls. We talked about daily routine, sleep patterns. Stephanie Welch talked about not feels as energetic recently, not feeling like doing much of anything. We talked about energy  levels, sleep patterns, hygiene, napping during the day. We talked about the importance of mobility, fall risk, ambulating with walker. Medical goals, medications, poc reviewed. Therapeutic listening, emotional support provided. Update staff, no new changes recommended to poc today. No new recommendations today, will continue to monitor/follow weights, appetite, functional abilities, cognitive, chronic disease progression   History obtained from review of EMR, discussion with primary team, and interview with family, facility staff/caregiver and/or Stephanie. Welch.  I reviewed available labs, medications, imaging, studies and related documents from the EMR.  Records reviewed and summarized above.    Physical Exam: General: frail appearing, elderly female, pleasant ENMT: oral mucous membranes moist Cardio: RRR Pulm: Breath sounds clear, decreased bases Neuro:  no generalized weakness,  no cognitive impairment Psych: non-anxious affect, A and O x 3 Thank you for the opportunity to participate in the care of Stephanie. Welch. Please call our office at [phone] if we can be of additional assistance.   Siria Calandro Ihor Gully, NP

## 2022-11-10 ENCOUNTER — Encounter: Payer: Self-pay | Admitting: Nurse Practitioner

## 2022-11-10 ENCOUNTER — Non-Acute Institutional Stay: Payer: Medicare PPO | Admitting: Nurse Practitioner

## 2022-11-10 DIAGNOSIS — R634 Abnormal weight loss: Secondary | ICD-10-CM

## 2022-11-10 DIAGNOSIS — F0280 Dementia in other diseases classified elsewhere without behavioral disturbance: Secondary | ICD-10-CM

## 2022-11-10 DIAGNOSIS — E46 Unspecified protein-calorie malnutrition: Secondary | ICD-10-CM

## 2022-11-10 DIAGNOSIS — Z515 Encounter for palliative care: Secondary | ICD-10-CM

## 2022-11-10 NOTE — Progress Notes (Signed)
Stephanie Welch Consult Note Telephone: [phone]  Fax: [phone]    Date of encounter: 11/10/22 11:10 AM PATIENT NAME: Stephanie Welch Stephanie Welch [id number]   [phone] (home)  DOB: 1927-04-26 MRN: [id number] PRIMARY CARE PROVIDER:    Kirk Ruths, MD,  [facility]:    Contact Information     Name Relation Home Work Mobile   [place] [phone]    [place] Fleet [phone]       I met face to face with patient in facility. Palliative Care was asked to follow this patient by consultation request of  Stephanie Ruths, MD/[facility] to address advance care planning and complex medical decision making. This is a follow up visit.                                  ASSESSMENT AND PLAN / RECOMMENDATIONS:  Symptom Management/Plan: 1. Advance Care Planning;  DNR 2. Goals of Care: Goals include to maximize quality of life and symptom management. Our advance care planning conversation included a discussion about:    The value and importance of advance care planning  Exploration of personal, cultural or spiritual beliefs that might influence medical decisions  Exploration of goals of care in the event of a sudden injury or illness  Identification and preparation of a healthcare agent  Review and updating or creation of an advance directive document. 3. Palliative care encounter; Palliative care encounter; Palliative medicine team will continue to support patient, patient's family, and medical team. Visit consisted of counseling and education dealing with the complex and emotionally intense issues of symptom management and palliative care in the setting of serious and potentially life-threatening illness   4. Alzheimer's dementia-progressive, ongoing support, encourage self independence as able, supportive role, medications, medical  goals reviewed.    5. Protein calorie malnutrition-, weight loss; reviewed weights, continue to monitor weights, meal%. Discussed nutrition, supplements, favorite foods with Stephanie Welch, encouraged to eat.  07/28/2022 weight 169.8 lbs 08/28/2022 weight 167 lbs 09/28/2022 weight 169.8 lbs 11/01/2022 weight 162.1 lbs Follow up Palliative Care Visit: Palliative care will continue to follow for complex medical decision making, advance care planning, and clarification of goals. Return 4 to 8 weeks or prn.   I spent 45 minutes providing this consultation. More than 50% of the time in this consultation was spent in counseling and care coordination. PPS: 50% Chief Complaint: Follow up palliative consult for complex medical decision making, address goals, manage ongoing symptoms   HISTORY OF PRESENT ILLNESS:  Stephanie Welch is a 89 y.o. year old female  with multiple medical problems including Alzheimer's dementia, heart failure, cardiomyopathy, atrial fibrillation, hypertension, diabetes, hypothyroidism, macular degeneration, hx of femur fracture. Stephanie Welch currently resides at [facility] [place]. Stephanie Welch does require assistance with transfers, bathing, dressing, toileting. Stephanie Welch requires assistance for tray set up, able to feed self, appetite has been stable per staff. No recent falls, wounds, hospitalizations, infections. Stephanie Welch does ambulate with a walker. Purpose of today PC f/u visit further discussion monitor trends of appetite, weights, monitor for functional, cognitive decline with chronic disease progression, assess any active symptoms, supportive role. At present Stephanie Welch is sitting in the chair in her room.   Medical goals, medications, poc reviewed. Therapeutic listening, emotional support provided. Update staff, no new  changes recommended to poc today. No new recommendations today, will continue to monitor/follow weights, appetite, functional abilities, cognitive, chronic disease  progression   History obtained from review of EMR, discussion with primary team, and interview with family, facility staff/caregiver and/or Stephanie. Welch.  I reviewed available labs, medications, imaging, studies and related documents from the EMR.  Records reviewed and summarized above.    Physical Exam: General: frail appearing, elderly female, pleasant ENMT: oral mucous membranes moist Cardio: RRR Pulm: Breath sounds clear, decreased bases Neuro:  no generalized weakness,  no cognitive impairment Psych: non-anxious affect, A and O x 3 Thank you for the opportunity to participate in the care of Stephanie Welch. Please call our office at [phone] if we can be of additional assistance.   Stephanie Fifita Stephanie Gully, NP

## 2022-12-12 ENCOUNTER — Non-Acute Institutional Stay: Payer: Medicare PPO | Admitting: Nurse Practitioner

## 2022-12-12 ENCOUNTER — Encounter: Payer: Self-pay | Admitting: Nurse Practitioner

## 2022-12-12 DIAGNOSIS — F0280 Dementia in other diseases classified elsewhere without behavioral disturbance: Secondary | ICD-10-CM

## 2022-12-12 DIAGNOSIS — Z515 Encounter for palliative care: Secondary | ICD-10-CM

## 2022-12-12 DIAGNOSIS — E46 Unspecified protein-calorie malnutrition: Secondary | ICD-10-CM

## 2022-12-12 NOTE — Progress Notes (Signed)
Therapist, nutritional Palliative Care Consult Note Telephone: [phone]  Fax: [phone]    Date of encounter: 12/12/22 12:11 PM PATIENT NAME: Stephanie Welch [address] [place] [address]   [phone] (home)  DOB: Jan 08, 1927 MRN: [id number] PRIMARY CARE PROVIDER:    Lauro Regulus, MD,  [facility]  RESPONSIBLE PARTY:    Contact Information     Name Relation Home Work Mobile   [place] [phone]    Jerene Bears [phone]       I met face to face with patient in facility. Palliative Care was asked to follow this patient by consultation request of  Lauro Regulus, MD/[facility] to address advance care planning and complex medical decision making. This is a follow up visit.                                  ASSESSMENT AND PLAN / RECOMMENDATIONS:  Symptom Management/Plan: 1. Advance Care Planning;  DNR 2. Palliative care encounter; Palliative care encounter; Palliative medicine team will continue to support patient, patient's family, and medical team. Visit consisted of counseling and education dealing with the complex and emotionally intense issues of symptom management and palliative care in the setting of serious and potentially life-threatening illness   3. Alzheimer's dementia-progressive, ongoing support, encourage self independence as able, supportive role, medications, medical goals reviewed.    4. Protein calorie malnutrition-, weight gain; reviewed weights, continue to monitor weights, meal%. Discussed nutrition, supplements, favorite foods with Stephanie Welch, encouraged to eat.  07/28/2022 weight 169.8 lbs 08/28/2022 weight 167 lbs 09/28/2022 weight 169.8 lbs 11/01/2022 weight 162.1 lbs 11/28/2022 weight 171.1 lbs Follow up Palliative Care Visit: Palliative care will continue to follow for complex medical decision making, advance care planning, and clarification of  goals. Return 4 to 8 weeks or prn.   I spent 46 minutes providing this consultation. More than 50% of the time in this consultation was spent in counseling and care coordination. PPS: 50% Chief Complaint: Follow up palliative consult for complex medical decision making, address goals, manage ongoing symptoms   HISTORY OF PRESENT ILLNESS:  MCKINZIE SAKSA is a 89 y.o. year old female  with multiple medical problems including Alzheimer's dementia, heart failure, cardiomyopathy, atrial fibrillation, hypertension, diabetes, hypothyroidism, macular degeneration, hx of femur fracture. Stephanie Welch currently resides at [facility]. Stephanie Welch does require assistance with transfers, bathing, dressing, toileting. Stephanie Welch requires assistance for tray set up, able to feed self, appetite has been stable per staff. No recent falls, wounds, hospitalizations, infections. Stephanie Welch does ambulate with a walker. Purpose of today PC f/u visit further discussion monitor trends of appetite, weights, monitor for functional, cognitive decline with chronic disease progression, assess any active symptoms, supportive role. At present Stephanie Welch is lying in bed.    Medical goals, medications, poc reviewed. Therapeutic listening, emotional support provided. Update staff, no new changes recommended to poc today. No new recommendations today, will continue to monitor/follow weights, appetite, functional abilities, cognitive, chronic disease progression   History obtained from review of EMR, discussion with primary team, and interview with family, facility staff/caregiver and/or Stephanie. Welch.  I reviewed available labs, medications, imaging, studies and related documents from the EMR.  Records reviewed and summarized above.    Physical Exam: General: frail appearing, elderly female, pleasant ENMT: oral mucous membranes moist Cardio:  RRR Pulm: Breath sounds clear, decreased bases Neuro:  + generalized weakness,  +  cognitive impairment Psych: non-anxious affect, A and Oriented to self Thank you for the opportunity to participate in the care of Stephanie Welch. Please call our office at [phone] if we can be of additional assistance.   Jovanna Hodges Prince Rome, NP

## 2023-01-30 ENCOUNTER — Encounter: Payer: Self-pay | Admitting: Nurse Practitioner

## 2023-01-30 ENCOUNTER — Non-Acute Institutional Stay: Payer: Medicare PPO | Admitting: Nurse Practitioner

## 2023-01-30 DIAGNOSIS — G309 Alzheimer's disease, unspecified: Secondary | ICD-10-CM

## 2023-01-30 DIAGNOSIS — E46 Unspecified protein-calorie malnutrition: Secondary | ICD-10-CM

## 2023-01-30 DIAGNOSIS — R634 Abnormal weight loss: Secondary | ICD-10-CM

## 2023-01-30 DIAGNOSIS — Z515 Encounter for palliative care: Secondary | ICD-10-CM

## 2023-01-30 NOTE — Progress Notes (Signed)
Therapist, nutritional Palliative Care Consult Note Telephone: [phone]  Fax: [phone]    Date of encounter: 01/30/23 4:11 PM PATIENT NAME: Stephanie Welch [address] [place] [address]   [phone] (home)  DOB: 1926-09-30 MRN: [id number] PRIMARY CARE PROVIDER:    Lauro Regulus, MD,  [facility]  RESPONSIBLE PARTY:    Contact Information     Name Relation Home Work Mobile   [place] [phone]    Jerene Bears [phone]          I met face to face with patient in facility. Palliative Care was asked to follow this patient by consultation request of  Lauro Regulus, MD/[facility] to address advance care planning and complex medical decision making. This is a follow up visit.                                  ASSESSMENT AND PLAN / RECOMMENDATIONS:  Symptom Management/Plan: 1. Advance Care Planning;  DNR 2. Palliative care encounter; Palliative care encounter; Palliative medicine team will continue to support patient, patient's family, and medical team. Visit consisted of counseling and education dealing with the complex and emotionally intense issues of symptom management and palliative care in the setting of serious and potentially life-threatening illness   3. Alzheimer's dementia-progressive, ongoing support, encourage self independence as able, supportive role, medications, medical goals reviewed.    4. Protein calorie malnutrition-, weight gain; reviewed weights, continue to monitor weights, meal%. Discussed nutrition, supplements, favorite foods with Stephanie Welch, encouraged to eat.   09/28/2022 weight 169.8 lbs 11/01/2022 weight 162.1 lbs 11/28/2022 weight 171.1 lbs 12/27/2022 weight 171 lbs Follow up Palliative Care Visit: Palliative care will continue to follow for complex medical decision making, advance care planning, and clarification of goals. Return 4 to 8  weeks or prn.   I spent 47 minutes providing this consultation. More than 50% of the time in this consultation was spent in counseling and care coordination. PPS: 50% Chief Complaint: Follow up palliative consult for complex medical decision making, address goals, manage ongoing symptoms   HISTORY OF PRESENT ILLNESS:  Stephanie Welch is a 89 y.o. year old female  with multiple medical problems including Alzheimer's dementia, heart failure, cardiomyopathy, atrial fibrillation, hypertension, diabetes, hypothyroidism, macular degeneration, hx of femur fracture. Stephanie Welch currently resides at [facility] [place], [facility]. Stephanie Welch does require assistance with transfers, bathing, dressing, toileting. Stephanie Welch requires assistance for tray set up, able to feed self, appetite has been stable per staff. No recent falls, wounds, hospitalizations, infections. Stephanie Welch does ambulate with a walker. Purpose of today PC f/u visit further discussion monitor trends of appetite, weights, monitor for functional, cognitive decline with chronic disease progression, assess any active symptoms, supportive role. At present Stephanie Welch is sitting in the chair in her room, feeding herself lunch with declined appetite. Stephanie Welch does make eye contact.  Supportive visit. Medications, medical goals, poc reviewed, attempted to contact Bosie Clos for update, overall chronically declining, though has not lost weight, actually gained this visit. No new recommendations today, will continue to monitor/follow weights, appetite, functional abilities, cognitive, chronic disease progression. Updated staff   History obtained from review of EMR, discussion with primary team, and interview with family, facility staff/caregiver and/or Stephanie. Welch.  I reviewed available labs, medications, imaging, studies and related documents from the EMR.  Records reviewed and summarized above.    Physical Exam: General: frail appearing, elderly female,  pleasant ENMT: oral mucous membranes moist Cardio: RRR Pulm: Breath sounds clear, decreased bases Neuro:  + generalized weakness,  + cognitive impairment Psych: non-anxious affect, A and Oriented to self  Thank you for the opportunity to participate in the care of Stephanie Welch. Please call our office at [phone] if we can be of additional assistance.   Marteze Vecchio Prince Rome, NP

## 2023-04-13 ENCOUNTER — Emergency Department: Payer: Medicare PPO

## 2023-04-13 ENCOUNTER — Emergency Department
Admission: EM | Admit: 2023-04-13 | Discharge: 2023-04-13 | Disposition: A | Discharge status: Home / Self Care | Payer: Medicare PPO | Attending: Emergency Medicine | Admitting: Emergency Medicine

## 2023-04-13 DIAGNOSIS — W19XXXA Unspecified fall, initial encounter: Secondary | ICD-10-CM

## 2023-04-13 DIAGNOSIS — Z23 Encounter for immunization: Secondary | ICD-10-CM | POA: Insufficient documentation

## 2023-04-13 DIAGNOSIS — W01198A Fall on same level from slipping, tripping and stumbling with subsequent striking against other object, initial encounter: Secondary | ICD-10-CM | POA: Insufficient documentation

## 2023-04-13 DIAGNOSIS — S51812A Laceration without foreign body of left forearm, initial encounter: Secondary | ICD-10-CM | POA: Diagnosis not present

## 2023-04-13 DIAGNOSIS — S0181XA Laceration without foreign body of other part of head, initial encounter: Secondary | ICD-10-CM | POA: Diagnosis not present

## 2023-04-13 DIAGNOSIS — T148XXA Other injury of unspecified body region, initial encounter: Secondary | ICD-10-CM

## 2023-04-13 MED ORDER — ACETAMINOPHEN 500 MG PO TABS
1000.0000 mg | ORAL_TABLET | Freq: Once | ORAL | Status: AC
  Administered 2023-04-13: 1000 mg via ORAL
  Filled 2023-04-13: qty 2

## 2023-04-13 MED ORDER — LIDOCAINE-EPINEPHRINE 2 %-1:100000 IJ SOLN
20.0000 mL | Freq: Once | INTRAMUSCULAR | Status: AC
  Administered 2023-04-13: 20 mL via INTRADERMAL
  Filled 2023-04-13: qty 1

## 2023-04-13 MED ORDER — TETANUS-DIPHTH-ACELL PERTUSSIS 5-2.5-18.5 LF-MCG/0.5 IM SUSY
0.5000 mL | PREFILLED_SYRINGE | Freq: Once | INTRAMUSCULAR | Status: AC
  Administered 2023-04-13: 0.5 mL via INTRAMUSCULAR
  Filled 2023-04-13: qty 0.5

## 2023-04-13 MED ORDER — BACITRACIN ZINC 500 UNIT/GM EX OINT: TOPICAL_OINTMENT | Freq: Once | CUTANEOUS | Status: AC

## 2023-04-13 MED ORDER — BACITRACIN ZINC 500 UNIT/GM EX OINT
TOPICAL_OINTMENT | CUTANEOUS | Status: AC
  Administered 2023-04-13: 1 via TOPICAL
  Filled 2023-04-13: qty 0.9

## 2023-04-13 MED ORDER — LIDOCAINE-EPINEPHRINE-TETRACAINE (LET) TOPICAL GEL
3.0000 mL | Freq: Once | TOPICAL | Status: DC
  Filled 2023-04-13 (×2): qty 3

## 2023-04-13 MED ORDER — BACITRACIN ZINC 500 UNIT/GM EX OINT: TOPICAL_OINTMENT | CUTANEOUS | 0 refills | Status: AC

## 2023-04-13 MED ORDER — ACETAMINOPHEN 325 MG PO TABS: 650.0000 mg | ORAL_TABLET | Freq: Three times a day (TID) | ORAL | 0 refills | Status: AC

## 2023-04-13 NOTE — Discharge Instructions (Addendum)
For the abrasions/skin tears:  Apply bacitracin or triple antibiotic ointment 2-3 x daily until healed Keep the wounds covered in non stick dressing until healed Follow-up with doctor in 1 week  Take Tylenol 650 mg three times daily scheduled for the next 5 days, then as needed

## 2023-04-13 NOTE — ED Triage Notes (Signed)
Pt in via [facility] for a fall. Pt from [address] [facility]). Pt found lying prone on the floor by staff. Hx of dementia. Unknown if pt is on blood thinners.Pt is noted to have a LAC on the left forehead. Skin tears to the left and right cheek, and left elbow/forearm.   Vitals per EMS:  O2- 97% BP- 185/103 BS- 110

## 2023-04-13 NOTE — ED Notes (Signed)
[facility] called for transport to [facility]

## 2023-04-13 NOTE — ED Provider Notes (Signed)
[facility] Provider Note    Event Date/Time   First MD Initiated Contact with Patient 04/13/23 1750     (approximate)   History   Fall   HPI  Stephanie Welch is a 89 y.o. female here with fall.  The patient reportedly was walking to close the door in her room when she fell forward, striking her forehead and left arm.  She sustained multiple skin tears to her left arm as well as a laceration to her left head.  She has otherwise been at her mental baseline.  She complains of some mild pain in her neck as well.  No numbness or weakness.  She was well prior to the fall.  No other complaints.     Physical Exam   Triage Vital Signs: ED Triage Vitals  Encounter Vitals Group     BP --      Systolic BP Percentile --      Diastolic BP Percentile --      Pulse --      Resp --      Temp 04/13/23 1741 98.2 F (36.8 C)     Temp src --      SpO2 --      Weight 04/13/23 1808 176 lb 5.9 oz (80 kg)     Height 04/13/23 1808 5\' 7"  (1.702 m)     Head Circumference --      Peak Flow --      Pain Score --      Pain Loc --      Pain Education --      Exclude from Growth Chart --     Most recent vital signs: Vitals:   04/13/23 1741 04/13/23 2010  BP:  (!) 178/98  Pulse:  95  Resp:  18  Temp: 98.2 F (36.8 C)   SpO2:  95%     General: Awake, no distress.  CV:  Good peripheral perfusion.  Regular rate and rhythm.  No murmurs. Resp:  Normal work of breathing.  Lungs clear to auscultation bilaterally. Abd:  No distention.  No tenderness.  No rebound or guarding. Other:  Superficial abrasion with small superficial laceration less than 1 cm to the left forehead.  Mild surrounding swelling.  Mild tenderness over the lower cervical spine.  Diffuse skin tears to the left arm with bony tenderness along the left upper arm.  No deformity.  No pain bilateral hips or pain with passive range of motion of the legs.   ED Results / Procedures / Treatments   Labs (all  labs ordered are listed, but only abnormal results are displayed) Labs Reviewed - No data to display   EKG    RADIOLOGY CT head: No acute intracranial abnormality CT C-spine: No acute cervical abnormality DG humerus left: Negative Chest x-ray: Chronic effusion versus pleural thickening DD pelvis: Negative   I also independently reviewed and agree with radiologist interpretations.   PROCEDURES:  Critical Care performed: No   ..Laceration Repair  Date/Time: 04/13/2023 9:28 PM  Performed by: Shaune Pollack, MD Authorized by: Shaune Pollack, MD   Consent:    Consent obtained:  Verbal   Consent given by:  Patient and guardian   Risks, benefits, and alternatives were discussed: yes     Risks discussed:  Infection, need for additional repair, nerve damage, poor wound healing, poor cosmetic result, pain, retained foreign body, tendon damage and vascular damage   Alternatives discussed:  Referral Anesthesia:  Anesthesia method:  Local infiltration   Local anesthetic:  Lidocaine 2% WITH epi Laceration details:    Location:  Scalp   Scalp location:  Frontal   Length (cm):  1.5 Pre-procedure details:    Preparation:  Patient was prepped and draped in usual sterile fashion Exploration:    Wound extent: no signs of injury, no underlying fracture and no vascular damage     Contaminated: no   Treatment:    Area cleansed with:  Povidone-iodine   Amount of cleaning:  Standard   Irrigation solution:  Sterile saline   Debridement:  None   Undermining:  None Skin repair:    Repair method:  Sutures   Suture size:  6-0   Wound skin closure material used: Vicryl rapide.   Suture technique:  Simple interrupted   Number of sutures:  3 Approximation:    Approximation:  Close Repair type:    Repair type:  Simple Post-procedure details:    Dressing:  Antibiotic ointment   Procedure completion:  Tolerated     MEDICATIONS ORDERED IN ED: Medications  acetaminophen  (TYLENOL) tablet 1,000 mg (1,000 mg Oral Given 04/13/23 2006)  lidocaine-EPINEPHrine (XYLOCAINE W/EPI) 2 %-1:100000 (with pres) injection 20 mL (20 mLs Intradermal Given by Other 04/13/23 2006)  bacitracin ointment (1 Application Topical Given 04/13/23 2047)  Tdap (BOOSTRIX) injection 0.5 mL (0.5 mLs Intramuscular Given 04/13/23 2102)     IMPRESSION / MDM / ASSESSMENT AND PLAN / ED COURSE  I reviewed the triage vital signs and the nursing notes.                              Differential diagnosis includes, but is not limited to, fall, intracranial injury, subdural, cervical injury, contusion, hematoma  Patient's presentation is most consistent with acute presentation with potential threat to life or bodily function.  89 year old female here with reported mechanical fall and superficial skin tears as well as left frontal laceration.  Patient is otherwise at her mental and physical baseline per family and POA.  CT head and C-spine reviewed and are negative.  Laceration repaired and all skin tears were cleaned and dressed with nonstick dressings.  Plain films of the humerus, chest, and pelvis are negative.  There was a question of pneumonitis on CT scan, chest x-ray is clear however and she has no cough, hypoxia, or symptoms to suggest infectious process.  Family states she is at baseline.  Will discharge with supportive care for her wounds and scheduled Tylenol for the neck several days at her skilled nursing facility.     FINAL CLINICAL IMPRESSION(S) / ED DIAGNOSES   Final diagnoses:  Fall, initial encounter  Multiple skin tears  Laceration of forehead, initial encounter     Rx / DC Orders   ED Discharge Orders          Ordered    acetaminophen (TYLENOL) 325 MG tablet  3 times daily        04/13/23 2112    bacitracin ointment        04/13/23 2113             Note:  This document was prepared using Dragon voice recognition software and may include unintentional dictation  errors.   Shaune Pollack, MD 04/13/23 2131

## 2024-05-28 DEATH — deceased
